# Patient Record
Sex: Female | Born: 1960 | Race: White | Hispanic: No | Marital: Married | Smoking: Never smoker
Health system: Southern US, Community
[De-identification: ages and names within clinical notes are randomized; demographics above are authoritative.]

## PROBLEM LIST (undated history)

## (undated) DIAGNOSIS — I739 Peripheral vascular disease, unspecified: Secondary | ICD-10-CM

## (undated) DIAGNOSIS — N200 Calculus of kidney: Secondary | ICD-10-CM

## (undated) DIAGNOSIS — I10 Essential (primary) hypertension: Secondary | ICD-10-CM

## (undated) HISTORY — DX: Peripheral vascular disease, unspecified: I73.9

## (undated) HISTORY — PX: COLONOSCOPY: SHX174

## (undated) HISTORY — DX: Calculus of kidney: N20.0

## (undated) HISTORY — PX: ABDOMINAL HYSTERECTOMY: SHX81

---

## 2004-02-28 ENCOUNTER — Ambulatory Visit: Payer: Self-pay | Admitting: Unknown Physician Specialty

## 2004-03-31 HISTORY — PX: TOTAL ABDOMINAL HYSTERECTOMY W/ BILATERAL SALPINGOOPHORECTOMY: SHX83

## 2004-04-09 ENCOUNTER — Inpatient Hospital Stay: Payer: Self-pay | Admitting: Unknown Physician Specialty

## 2004-12-10 ENCOUNTER — Ambulatory Visit: Payer: Self-pay

## 2005-01-08 ENCOUNTER — Ambulatory Visit: Payer: Self-pay

## 2005-05-30 ENCOUNTER — Ambulatory Visit: Payer: Self-pay | Admitting: Unknown Physician Specialty

## 2005-12-04 ENCOUNTER — Ambulatory Visit: Payer: Self-pay | Admitting: Gastroenterology

## 2006-08-19 ENCOUNTER — Ambulatory Visit: Payer: Self-pay | Admitting: Unknown Physician Specialty

## 2007-11-10 ENCOUNTER — Ambulatory Visit: Payer: Self-pay | Admitting: Unknown Physician Specialty

## 2008-12-06 LAB — HM PAP SMEAR: HM Pap smear: NEGATIVE

## 2008-12-14 ENCOUNTER — Ambulatory Visit: Payer: Self-pay | Admitting: Unknown Physician Specialty

## 2010-04-05 ENCOUNTER — Ambulatory Visit: Payer: Self-pay | Admitting: Unknown Physician Specialty

## 2010-04-09 ENCOUNTER — Emergency Department: Payer: Self-pay | Admitting: Emergency Medicine

## 2011-09-10 ENCOUNTER — Ambulatory Visit: Payer: Self-pay | Admitting: Family Medicine

## 2012-09-10 ENCOUNTER — Ambulatory Visit: Payer: Self-pay | Admitting: Family Medicine

## 2013-11-16 ENCOUNTER — Ambulatory Visit: Payer: Self-pay | Admitting: Family Medicine

## 2013-11-16 LAB — HM MAMMOGRAPHY

## 2014-09-27 ENCOUNTER — Other Ambulatory Visit
Admission: RE | Admit: 2014-09-27 | Disposition: A | Discharge status: Home / Self Care | Payer: BLUE CROSS/BLUE SHIELD | Source: Ambulatory Visit | Attending: Cardiology | Admitting: Cardiology

## 2014-09-28 ENCOUNTER — Other Ambulatory Visit
Admission: RE | Admit: 2014-09-28 | Discharge: 2014-09-28 | Disposition: A | Discharge status: Home / Self Care | Payer: BLUE CROSS/BLUE SHIELD | Source: Ambulatory Visit | Attending: Cardiology | Admitting: Cardiology

## 2014-09-29 ENCOUNTER — Other Ambulatory Visit
Admission: RE | Admit: 2014-09-29 | Discharge: 2014-09-29 | Disposition: A | Discharge status: Home / Self Care | Payer: BLUE CROSS/BLUE SHIELD | Source: Ambulatory Visit | Attending: Internal Medicine | Admitting: Internal Medicine

## 2014-09-29 DIAGNOSIS — Z1501 Genetic susceptibility to malignant neoplasm of breast: Secondary | ICD-10-CM | POA: Diagnosis present

## 2014-09-29 DIAGNOSIS — Z0189 Encounter for other specified special examinations: Secondary | ICD-10-CM | POA: Insufficient documentation

## 2014-09-29 LAB — CBC WITH DIFFERENTIAL/PLATELET
Basophils Absolute: 0.1 10*3/uL (ref 0–0.1)
Basophils Relative: 1 %
Eosinophils Absolute: 0.6 10*3/uL (ref 0–0.7)
Eosinophils Relative: 8 %
HCT: 39.6 % (ref 35.0–47.0)
Hemoglobin: 13.7 g/dL (ref 12.0–16.0)
Lymphocytes Relative: 32 %
Lymphs Abs: 2.2 10*3/uL (ref 1.0–3.6)
MCH: 29.4 pg (ref 26.0–34.0)
MCHC: 34.6 g/dL (ref 32.0–36.0)
MCV: 85 fL (ref 80.0–100.0)
Monocytes Absolute: 0.5 10*3/uL (ref 0.2–0.9)
Monocytes Relative: 8 %
Neutro Abs: 3.6 10*3/uL (ref 1.4–6.5)
Neutrophils Relative %: 51 %
Platelets: 240 10*3/uL (ref 150–440)
RBC: 4.65 MIL/uL (ref 3.80–5.20)
RDW: 13 % (ref 11.5–14.5)
WBC: 6.9 10*3/uL (ref 3.6–11.0)

## 2014-09-29 LAB — BASIC METABOLIC PANEL
Anion gap: 13 (ref 5–15)
BUN: 8 mg/dL (ref 6–20)
CO2: 22 mmol/L (ref 22–32)
Calcium: 9 mg/dL (ref 8.9–10.3)
Chloride: 107 mmol/L (ref 101–111)
Creatinine, Ser: 0.87 mg/dL (ref 0.44–1.00)
GFR calc Af Amer: >60 mL/min (ref >60)
GFR calc non Af Amer: >60 mL/min (ref >60)
Glucose, Bld: 100 mg/dL — ABNORMAL HIGH (ref 65–99)
Potassium: 4 mmol/L (ref 3.5–5.1)
Sodium: 142 mmol/L (ref 135–145)

## 2014-09-29 LAB — LIPID PANEL
Cholesterol: 223 mg/dL — ABNORMAL HIGH (ref 0–200)
HDL: 47 mg/dL (ref >40)
LDL Cholesterol: 152 mg/dL — ABNORMAL HIGH (ref 0–99)
Total CHOL/HDL Ratio: 4.7 RATIO
Triglycerides: 120 mg/dL (ref <150)
VLDL: 24 mg/dL (ref 0–40)

## 2014-09-29 LAB — TSH: TSH: 2.924 u[IU]/mL (ref 0.350–4.500)

## 2014-10-18 LAB — MISC LABCORP TEST (SEND OUT)
LabCorp test name: 252911
Labcorp test code: 252911

## 2015-05-13 ENCOUNTER — Emergency Department
Admission: EM | Admit: 2015-05-13 | Discharge: 2015-05-14 | Disposition: A | Discharge status: Home / Self Care | Payer: BLUE CROSS/BLUE SHIELD | Attending: Emergency Medicine | Admitting: Emergency Medicine

## 2015-05-13 ENCOUNTER — Encounter: Payer: Self-pay | Admitting: Emergency Medicine

## 2015-05-13 DIAGNOSIS — I10 Essential (primary) hypertension: Secondary | ICD-10-CM | POA: Insufficient documentation

## 2015-05-13 DIAGNOSIS — R42 Dizziness and giddiness: Secondary | ICD-10-CM | POA: Diagnosis present

## 2015-05-13 DIAGNOSIS — F439 Reaction to severe stress, unspecified: Secondary | ICD-10-CM | POA: Diagnosis not present

## 2015-05-13 HISTORY — DX: Essential (primary) hypertension: I10

## 2015-05-13 LAB — CBC
HCT: 40.4 % (ref 35.0–47.0)
Hemoglobin: 14.2 g/dL (ref 12.0–16.0)
MCH: 29.5 pg (ref 26.0–34.0)
MCHC: 35.1 g/dL (ref 32.0–36.0)
MCV: 84 fL (ref 80.0–100.0)
Platelets: 283 10*3/uL (ref 150–440)
RBC: 4.81 MIL/uL (ref 3.80–5.20)
RDW: 12.8 % (ref 11.5–14.5)
WBC: 8.5 10*3/uL (ref 3.6–11.0)

## 2015-05-13 LAB — URINALYSIS COMPLETE WITH MICROSCOPIC (ARMC ONLY)
Bacteria, UA: NONE SEEN
Bilirubin Urine: NEGATIVE
Glucose, UA: NEGATIVE mg/dL
Hgb urine dipstick: NEGATIVE
Ketones, ur: NEGATIVE mg/dL
Nitrite: NEGATIVE
Protein, ur: NEGATIVE mg/dL
Specific Gravity, Urine: 1.006 (ref 1.005–1.030)
pH: 7 (ref 5.0–8.0)

## 2015-05-13 LAB — GLUCOSE, CAPILLARY: Glucose-Capillary: 118 mg/dL — ABNORMAL HIGH (ref 65–99)

## 2015-05-13 LAB — BASIC METABOLIC PANEL
Anion gap: 7 (ref 5–15)
BUN: 9 mg/dL (ref 6–20)
CO2: 25 mmol/L (ref 22–32)
Calcium: 9.2 mg/dL (ref 8.9–10.3)
Chloride: 110 mmol/L (ref 101–111)
Creatinine, Ser: 0.88 mg/dL (ref 0.44–1.00)
GFR calc Af Amer: >60 mL/min (ref >60)
GFR calc non Af Amer: >60 mL/min (ref >60)
Glucose, Bld: 117 mg/dL — ABNORMAL HIGH (ref 65–99)
Potassium: 3.8 mmol/L (ref 3.5–5.1)
Sodium: 142 mmol/L (ref 135–145)

## 2015-05-13 NOTE — ED Notes (Signed)
Pt. States feeling dizzy for the past couple days worse today.  Pt. States increased stressors recently.  Pt. States mild rash to face.

## 2015-05-13 NOTE — ED Provider Notes (Signed)
[facility] Emergency Department Provider Note  ____________________________________________  Time seen: Approximately 11:29 PM  I have reviewed the triage vital signs and the nursing notes.   HISTORY  Chief Complaint Dizziness    HPI Angie Duffy is a 55 y.o. female who comes into the hospital today with some dizziness. The patient reports that she was sitting on the couch tonight and she started feeling dizzy. She reports that she also had symptoms of this started 3 days ago and it's been coming and going. The patient thought she may be getting sick or may be having vertigo as her mother has had vertigo in the past. The patient reports that her blood pressure was high for her and it was 161 systolic. The patient also had a strange sensation in her head and the more she thought about it the more she worried so she decided to come in and get checked out. 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 She reports that she was feeling lightheaded and not like the room was spinning. The patient reports that she does not eat much protein and tries to watch what she drinks but is unsure she's been drinking enough. The patient reports that one week ago she had a bright flashing light in her eyes that lasted a few minutes but was never followed by anything and it resolved on its own. The patient drank 2 protein shakes before she came in tonight. She reports her hair is been thinning and she is also concerned because she put her mom in assisted living and she knows her mom did not want to be there. The patient is here for evaluation.   Past Medical History  Diagnosis Date  . Hypertension     There are no active problems to display for this patient.   Past Surgical History  Procedure Laterality Date  . Abdominal hysterectomy      No current outpatient prescriptions on  file.  Allergies Levaquin  History reviewed. No pertinent family history.  Social History Social History  Substance Use Topics  . Smoking status: Never Smoker   . Smokeless tobacco: None  . Alcohol Use: No    Review of Systems Constitutional: No fever/chills Eyes: No visual changes. ENT: No sore throat. Cardiovascular: Denies chest pain. Respiratory: Denies shortness of breath. Gastrointestinal: No abdominal pain.  No nausea, no vomiting.  No diarrhea.  No constipation. Genitourinary: Negative for dysuria. Musculoskeletal: Negative for back pain. Skin: Negative for rash. Neurological: Dizziness with no headache  10-point ROS otherwise negative.  ____________________________________________   PHYSICAL EXAM:  VITAL SIGNS: ED Triage Vitals  Enc Vitals Group     BP 05/13/15 2039 162/94 mmHg     Pulse Rate 05/13/15 2039 78     Resp 05/13/15 2039 18     Temp 05/13/15 2039 98.2 F (36.8 C)     Temp src --      SpO2 05/13/15 2039 98 %     Weight 05/13/15 2039 223 lb (101.152 kg)     Height 05/13/15 2039  (1.727 m)     Head Cir --      Peak Flow --      Pain Score 05/13/15 2040 0     Pain Loc --      Pain Edu? --      Excl. in GC? --     Constitutional: Alert and  oriented. Well appearing and in no acute distress. Eyes: Conjunctivae are normal. PERRL. EOMI. Head: Atraumatic. Nose: No congestion/rhinnorhea. Mouth/Throat: Mucous membranes are moist.  Oropharynx non-erythematous. Cardiovascular: Normal rate, regular rhythm. Grossly normal heart sounds.  Good peripheral circulation. Respiratory: Normal respiratory effort.  No retractions. Lungs CTAB. Gastrointestinal: Soft and nontender. No distention. Positive bowel sounds Musculoskeletal: No lower extremity tenderness nor edema.   Neurologic:  Normal speech and language. No gross focal neurologic deficits are appreciated. Patient with no finger to limb ataxia and cranial nerves II through XII are grossly  intact Skin:  Skin is warm, dry and intact. Psychiatric: Mood and affect are normal.   ____________________________________________   LABS (all labs ordered are listed, but only abnormal results are displayed)  Labs Reviewed  URINALYSIS COMPLETEWITH MICROSCOPIC ([facility] ONLY) - Abnormal; Notable for the following:    Color, Urine YELLOW (*)    APPearance HAZY (*)    Leukocytes, UA 1+ (*)    Squamous Epithelial / LPF 0-5 (*)    All other components within normal limits  BASIC METABOLIC PANEL - Abnormal; Notable for the following:    Glucose, Bld 117 (*)    All other components within normal limits  GLUCOSE, CAPILLARY - Abnormal; Notable for the following:    Glucose-Capillary 118 (*)    All other components within normal limits  CBC  TROPONIN I  CBG MONITORING, ED   ____________________________________________  EKG  ED ECG REPORT I, Rebecka Apley, the attending physician, personally viewed and interpreted this ECG.   Date: 05/13/2015  EKG Time: 2025  Rate: 81  Rhythm: normal sinus rhythm  Axis: Normal  Intervals:none  ST&T Change: None  ____________________________________________  RADIOLOGY  CT head: No acute intracranial abnormalities ____________________________________________   PROCEDURES  Procedure(s) performed: None  Critical Care performed: No  ____________________________________________   INITIAL IMPRESSION / ASSESSMENT AND PLAN / ED COURSE  Pertinent labs & imaging results that were available during my care of the patient were reviewed by me and considered in my medical decision making (see chart for details).  This is a 55 year old female who comes into the hospital today with some dizziness. I will check orthostatics as the patient reports that she has not been drinking much. I did offer to do a CT scan on the patient but the patient is concerned about cancer in the future. I will give her some time as I add on a troponin as well as do  orthostatics and then reassess if the patient would like to receive a CT scan to evaluate her symptoms. The patient's blood pressure at this time is 159/86.  The patient was not orthostatic and her CT head was unremarkable. The patient at this time is having no symptoms but is mildly anxious about what it may mean. The patient is asking about atrophy of her brain and still concerned that her CT scan is going to give her brain cancer. I did attempt to reassure the patient and I informed her that she should follow back up with her primary care physician for further evaluation of her symptoms. The patient will be discharged home to follow-up with her primary care physician. ____________________________________________   FINAL CLINICAL IMPRESSION(S) / ED DIAGNOSES  Final diagnoses:  Dizziness  Lightheadedness      Rebecka Apley, MD 05/14/15 0221

## 2015-05-13 NOTE — ED Notes (Signed)
Blood sugar in triage 118.

## 2015-05-14 ENCOUNTER — Emergency Department: Payer: BLUE CROSS/BLUE SHIELD

## 2015-05-14 LAB — TROPONIN I: Troponin I: <0.03 ng/mL (ref <0.031)

## 2015-05-14 NOTE — ED Notes (Signed)
Pt discharged to home.  Family member driving.  Discharge instructions reviewed.  Verbalized understanding.  No questions or concerns at this time.  Teach back verified.  Pt in NAD.  No items left in ED.   

## 2015-05-14 NOTE — Discharge Instructions (Signed)

## 2015-05-14 NOTE — ED Notes (Signed)
Patient transported to CT 

## 2015-08-08 DIAGNOSIS — L821 Other seborrheic keratosis: Secondary | ICD-10-CM | POA: Diagnosis not present

## 2015-08-08 DIAGNOSIS — Z1283 Encounter for screening for malignant neoplasm of skin: Secondary | ICD-10-CM | POA: Diagnosis not present

## 2015-08-08 DIAGNOSIS — D485 Neoplasm of uncertain behavior of skin: Secondary | ICD-10-CM | POA: Diagnosis not present

## 2015-08-08 DIAGNOSIS — S30861A Insect bite (nonvenomous) of abdominal wall, initial encounter: Secondary | ICD-10-CM | POA: Diagnosis not present

## 2015-09-06 DIAGNOSIS — D225 Melanocytic nevi of trunk: Secondary | ICD-10-CM | POA: Diagnosis not present

## 2015-09-06 DIAGNOSIS — D485 Neoplasm of uncertain behavior of skin: Secondary | ICD-10-CM | POA: Diagnosis not present

## 2017-02-13 IMAGING — CT CT HEAD W/O CM
1 series · 16 of 30 positions shown, 20 images · non-contrast
Comparison: None.

CLINICAL DATA: Dizziness for couple of days, worse today.

EXAM:
CT HEAD WITHOUT CONTRAST
TECHNIQUE: Contiguous axial images were obtained from the base of the skull
through the vertex without intravenous contrast.

[Series 2: head wo · axial · 0.47mm/px · z∈[-175,-31]mm · 16 of 36 slices shown, 20 images]
[im 2/36  brain]
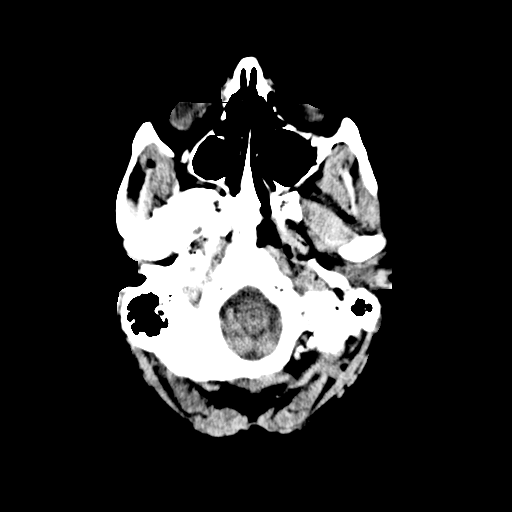
[im 2/36  bone]
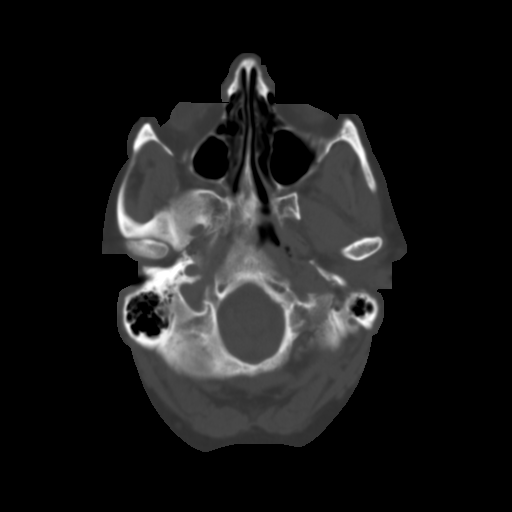
[im 4/36  brain]
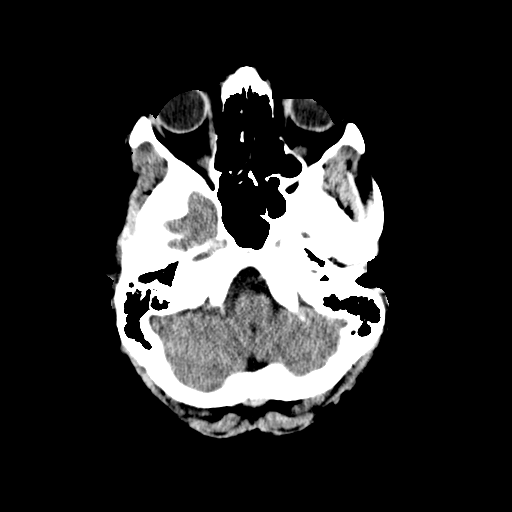
[im 7/36  brain]
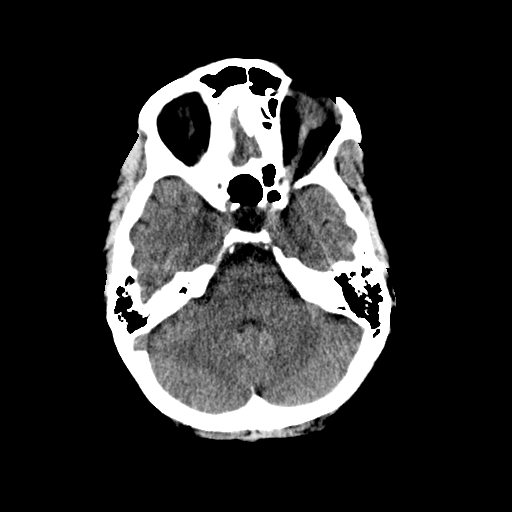
[im 9/36  brain]
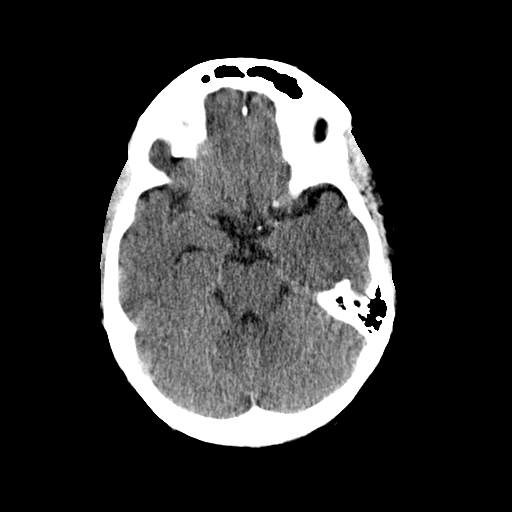
[im 10/36  brain]
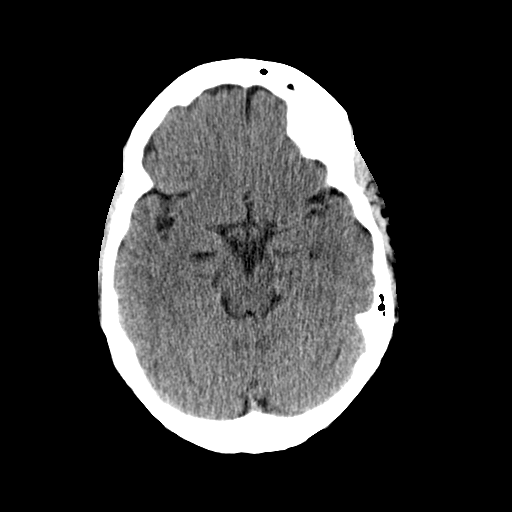
[im 10/36  bone]
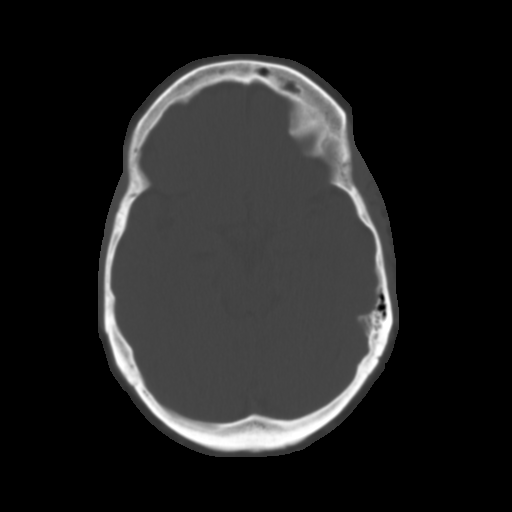
[im 13/36  brain]
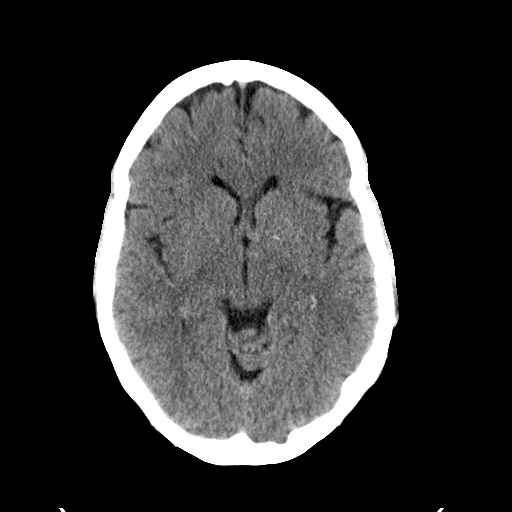
[im 15/36  brain]
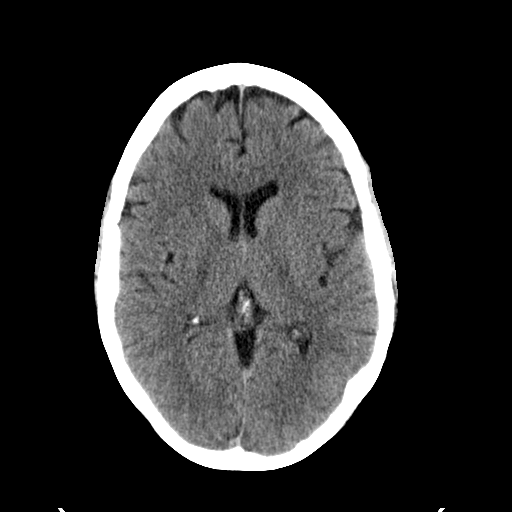
[im 17/36  brain]
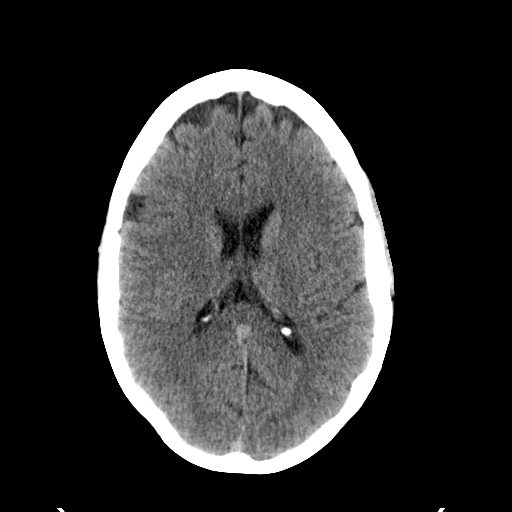
[im 19/36  brain]
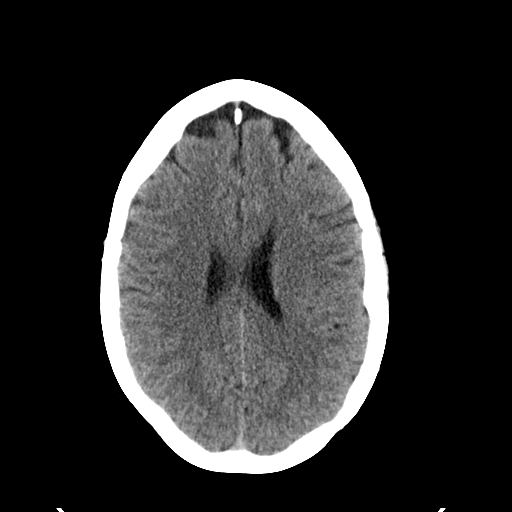
[im 19/36  bone]
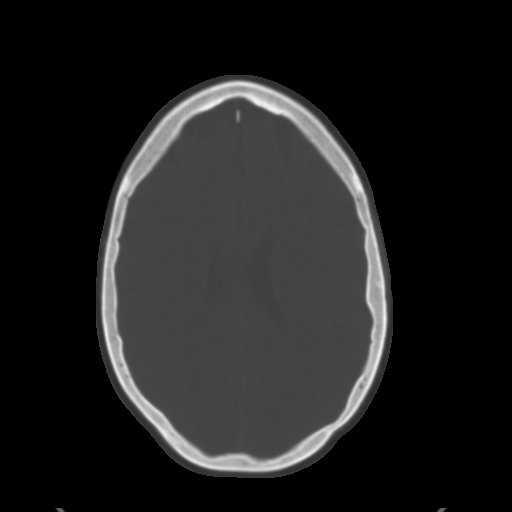
[im 21/36  brain]
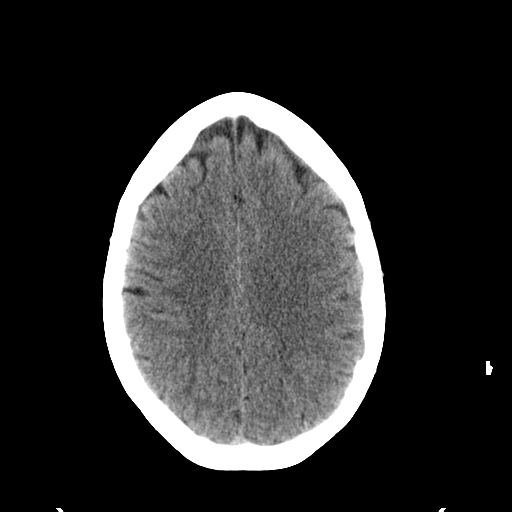
[im 23/36  brain]
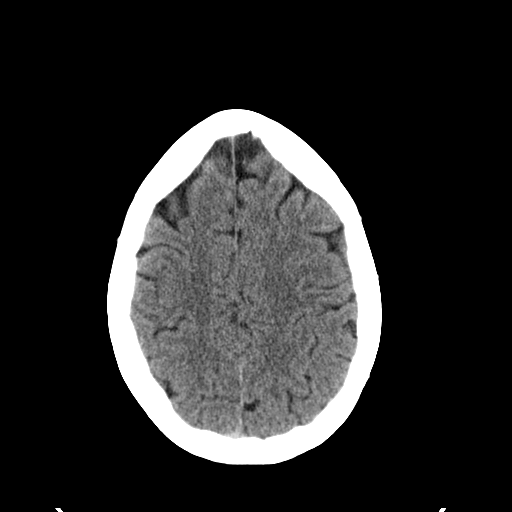
[im 26/36  brain]
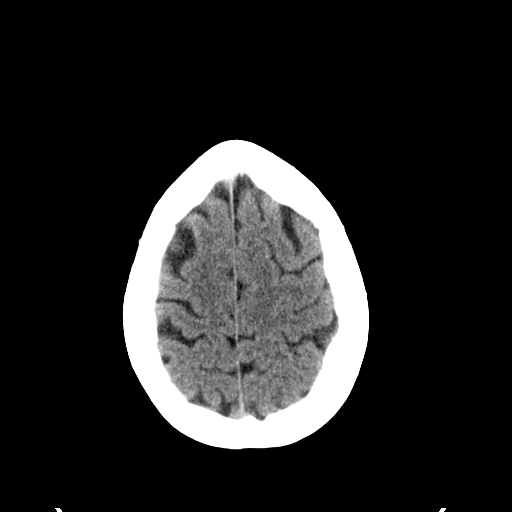
[im 27/36  brain]
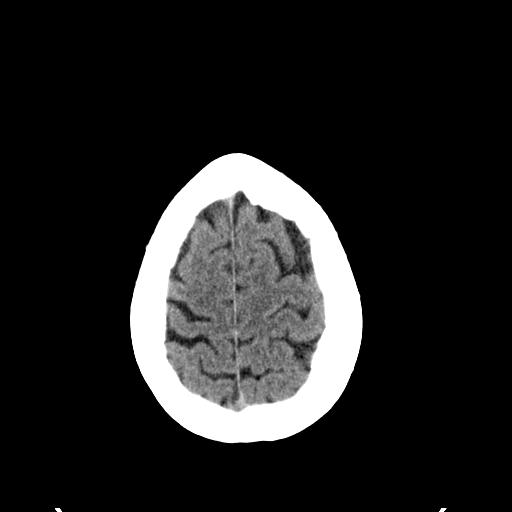
[im 27/36  bone]
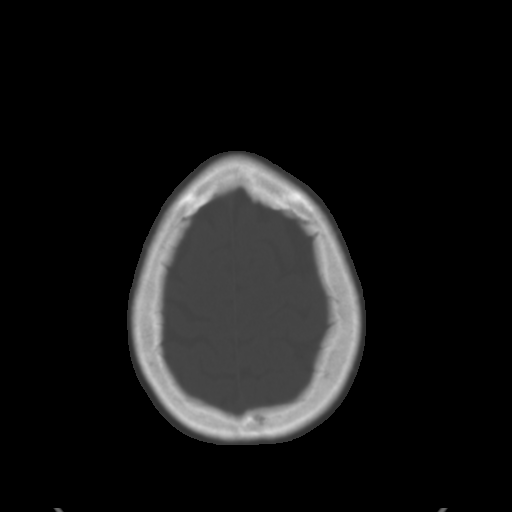
[im 29/36  brain]
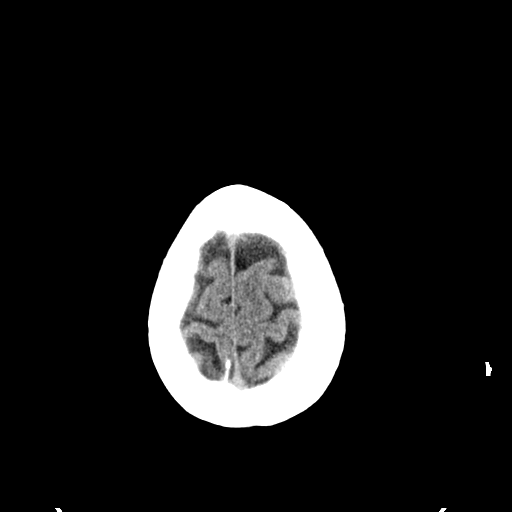
[im 32/36  brain]
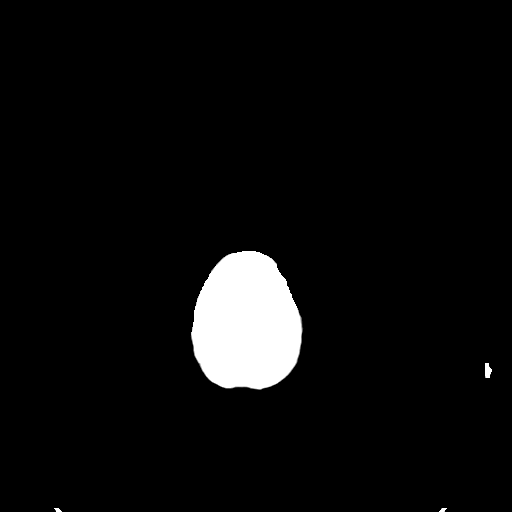
[im 34/36  brain]
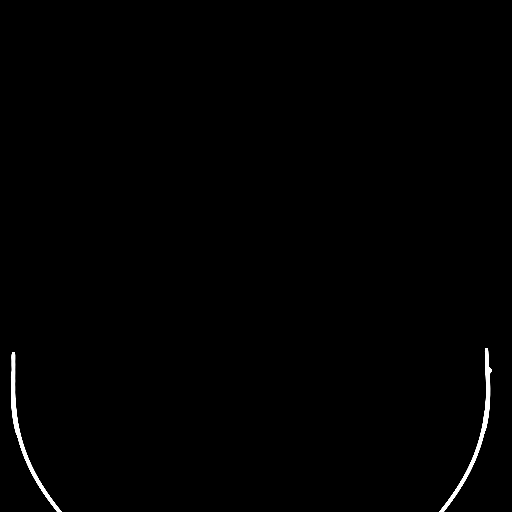

[16 of 30 positions shown; findings below may reference images not displayed]

FINDINGS: Ventricles and sulci appear symmetrical. No ventricular dilatation.
No mass effect or midline shift. No abnormal extra-axial fluid
collections. Gray-white matter junctions are distinct. Basal
cisterns are not effaced. No evidence of acute intracranial
hemorrhage. No depressed skull fractures. Visualized paranasal
sinuses and mastoid air cells are not opacified.
IMPRESSION: No acute intracranial abnormalities.

## 2017-03-18 ENCOUNTER — Other Ambulatory Visit: Payer: Self-pay | Admitting: Cardiology

## 2017-03-18 DIAGNOSIS — Z1231 Encounter for screening mammogram for malignant neoplasm of breast: Secondary | ICD-10-CM

## 2017-04-01 ENCOUNTER — Encounter: Payer: Self-pay | Admitting: Obstetrics and Gynecology

## 2017-04-01 ENCOUNTER — Ambulatory Visit: Payer: BLUE CROSS/BLUE SHIELD | Admitting: Obstetrics and Gynecology

## 2017-04-01 VITALS — BP 130/80 | HR 62 | Ht 68.0 in | Wt 230.0 lb

## 2017-04-01 DIAGNOSIS — Z13220 Encounter for screening for lipoid disorders: Secondary | ICD-10-CM | POA: Diagnosis not present

## 2017-04-01 DIAGNOSIS — Z1321 Encounter for screening for nutritional disorder: Secondary | ICD-10-CM

## 2017-04-01 DIAGNOSIS — Z1329 Encounter for screening for other suspected endocrine disorder: Secondary | ICD-10-CM

## 2017-04-01 DIAGNOSIS — Z1231 Encounter for screening mammogram for malignant neoplasm of breast: Secondary | ICD-10-CM

## 2017-04-01 DIAGNOSIS — L659 Nonscarring hair loss, unspecified: Secondary | ICD-10-CM | POA: Diagnosis not present

## 2017-04-01 DIAGNOSIS — Z1239 Encounter for other screening for malignant neoplasm of breast: Secondary | ICD-10-CM

## 2017-04-01 DIAGNOSIS — Z131 Encounter for screening for diabetes mellitus: Secondary | ICD-10-CM

## 2017-04-01 DIAGNOSIS — R6882 Decreased libido: Secondary | ICD-10-CM

## 2017-04-01 DIAGNOSIS — Z0000 Encounter for general adult medical examination without abnormal findings: Secondary | ICD-10-CM

## 2017-04-01 NOTE — Patient Instructions (Signed)
I value your feedback and entrusting us with your care. If you get a [facility] patient survey, I would appreciate you taking the time to let us know about your experience today. Thank you! 

## 2017-04-01 NOTE — Progress Notes (Signed)
Chief Complaint  Patient presents with  . Advice Only    hair falling out, thyroid testing    HPI:      [name] is a 57 y.o. [id number] who LMP was No LMP recorded. Patient has had a hysterectomy., presents today for NP eval of thinning hair for many yrs. Saw Dr. Dear for same sx in 2014 and has had neg thyroid labs in past. Pt's mom had thinning hair. Pt is vegan and doesn't get a lot of protein in her diet.   She is s/p TAHBSO 2006 for menorrhagia/leio. She denies any vasomotor sx but does have decreased libido. She wonders if HRT would be helpful.   She would like fasting labs done since she has not had insurance till recently.   She notices white bumps under her bilat breasts, particularly worse after exercise. No dryer sheet use. No breast masses/pain. Did note RT axillary mass/tenderness a few wks ago. Sx resolved. Has mammo sched 04/13/17.   Past Medical History:  Diagnosis Date  . Calculus of kidney   . Hypertension     Past Surgical History:  Procedure Laterality Date  . ABDOMINAL HYSTERECTOMY    . COLONOSCOPY    . TOTAL ABDOMINAL HYSTERECTOMY W/ BILATERAL SALPINGOOPHORECTOMY  2006    Family History  Problem Relation Age of Onset  . Emphysema Father   . Breast cancer Sister     Social History   Socioeconomic History  . Marital status: Married    Spouse name: Not on file  . Number of children: Not on file  . Years of education: Not on file  . Highest education level: Not on file  Social Needs  . Financial resource strain: Not on file  . Food insecurity - worry: Not on file  . Food insecurity - inability: Not on file  . Transportation needs - medical: Not on file  . Transportation needs - non-medical: Not on file  Occupational History  . Not on file  Tobacco Use  . Smoking status: Never Smoker  . Smokeless tobacco: Never Used  Substance and Sexual Activity  . Alcohol use: No  . Drug use: No  . Sexual activity: Not on file  Other Topics  Concern  . Not on file  Social History Narrative  . Not on file    No current outpatient medications on file.   ROS:  Review of Systems  Constitutional: Negative for fatigue, fever and unexpected weight change.  Respiratory: Negative for cough, shortness of breath and wheezing.   Cardiovascular: Negative for chest pain, palpitations and leg swelling.  Gastrointestinal: Negative for blood in stool, constipation, diarrhea, nausea and vomiting.  Endocrine: Negative for cold intolerance, heat intolerance and polyuria.  Genitourinary: Negative for dyspareunia, dysuria, flank pain, frequency, genital sores, hematuria, menstrual problem, pelvic pain, urgency, vaginal bleeding, vaginal discharge and vaginal pain.  Musculoskeletal: Negative for back pain, joint swelling and myalgias.  Skin: Positive for rash.  Neurological: Negative for dizziness, syncope, light-headedness, numbness and headaches.  Hematological: Negative for adenopathy.  Psychiatric/Behavioral: Negative for agitation, confusion, sleep disturbance and suicidal ideas. The patient is not nervous/anxious.      OBJECTIVE:   Vitals:  BP 130/80   Pulse 62   Ht 5\' 8"  (1.727 m)   Wt 230 lb (104.3 kg)   BMI 34.97 kg/m   Physical Exam  Constitutional: She is oriented to person, place, and time and well-developed, well-nourished, and in no distress.  Pulmonary/Chest: Right breast exhibits no  inverted nipple, no mass, no nipple discharge, no skin change and no tenderness. Left breast exhibits no inverted nipple, no mass, no nipple discharge, no skin change and no tenderness. Breasts are symmetrical.  Lymphadenopathy:    She has no axillary adenopathy.  Neurological: She is alert and oriented to person, place, and time.  Psychiatric: Memory, affect and judgment normal.  Vitals reviewed.   Assessment/Plan: Hair loss - Thinning hair for many yrs with neg thyroid labs in past. Pos FH hair thinning in mom. Check labs. If neg,  most likely hereditary. Can see derm for tx options. - Plan: TSH + free T4  Blood tests for routine general physical examination - Plan: Comprehensive metabolic panel, Lipid panel, Hemoglobin A1c, TSH + free T4, VITAMIN D 25 Hydroxy (Vit-D Deficiency, Fractures), B12 and Folate Panel  Screening cholesterol level - Plan: Lipid panel  Encounter for vitamin deficiency screening - Plan: VITAMIN D 25 Hydroxy (Vit-D Deficiency, Fractures), B12 and Folate Panel  Thyroid disorder screening - Plan: TSH + free T4  Screening for diabetes mellitus - Plan: Hemoglobin A1c  Screening breast examination - Neg exam. Has mammo sched.  Decreased libido - Discussed pros/cons HRT. Pt declines for now.     Return if symptoms worsen or fail to improve.  Angie Duffy B. Kyarra Vancamp, PA-C 04/01/2017 9:29 AM

## 2017-04-03 ENCOUNTER — Other Ambulatory Visit: Payer: BLUE CROSS/BLUE SHIELD

## 2017-04-03 DIAGNOSIS — Z1321 Encounter for screening for nutritional disorder: Secondary | ICD-10-CM

## 2017-04-03 DIAGNOSIS — Z1329 Encounter for screening for other suspected endocrine disorder: Secondary | ICD-10-CM

## 2017-04-03 DIAGNOSIS — Z0000 Encounter for general adult medical examination without abnormal findings: Secondary | ICD-10-CM | POA: Diagnosis not present

## 2017-04-03 DIAGNOSIS — L659 Nonscarring hair loss, unspecified: Secondary | ICD-10-CM

## 2017-04-03 DIAGNOSIS — Z13220 Encounter for screening for lipoid disorders: Secondary | ICD-10-CM

## 2017-04-03 DIAGNOSIS — Z131 Encounter for screening for diabetes mellitus: Secondary | ICD-10-CM

## 2017-04-04 LAB — COMPREHENSIVE METABOLIC PANEL
ALT: 43 IU/L — ABNORMAL HIGH (ref 0–32)
AST: 28 IU/L (ref 0–40)
Albumin/Globulin Ratio: 1.6 (ref 1.2–2.2)
Albumin: 4.2 g/dL (ref 3.5–5.5)
Alkaline Phosphatase: 56 IU/L (ref 39–117)
BUN/Creatinine Ratio: 13 (ref 9–23)
BUN: 11 mg/dL (ref 6–24)
Bilirubin Total: 0.4 mg/dL (ref 0.0–1.2)
CO2: 23 mmol/L (ref 20–29)
Calcium: 9.3 mg/dL (ref 8.7–10.2)
Chloride: 107 mmol/L — ABNORMAL HIGH (ref 96–106)
Creatinine, Ser: 0.87 mg/dL (ref 0.57–1.00)
GFR calc Af Amer: 86 mL/min/{1.73_m2} (ref >59)
GFR calc non Af Amer: 75 mL/min/{1.73_m2} (ref >59)
Globulin, Total: 2.7 g/dL (ref 1.5–4.5)
Glucose: 97 mg/dL (ref 65–99)
Potassium: 4.8 mmol/L (ref 3.5–5.2)
Sodium: 145 mmol/L — ABNORMAL HIGH (ref 134–144)
Total Protein: 6.9 g/dL (ref 6.0–8.5)

## 2017-04-04 LAB — B12 AND FOLATE PANEL
Folate: 10.3 ng/mL (ref >3.0)
Vitamin B-12: 207 pg/mL — ABNORMAL LOW (ref 232–1245)

## 2017-04-04 LAB — LIPID PANEL
Chol/HDL Ratio: 3.7 ratio (ref 0.0–4.4)
Cholesterol, Total: 216 mg/dL — ABNORMAL HIGH (ref 100–199)
HDL: 58 mg/dL (ref >39)
LDL Calculated: 139 mg/dL — ABNORMAL HIGH (ref 0–99)
Triglycerides: 96 mg/dL (ref 0–149)
VLDL Cholesterol Cal: 19 mg/dL (ref 5–40)

## 2017-04-04 LAB — TSH+FREE T4
Free T4: 1.03 ng/dL (ref 0.82–1.77)
TSH: 1.42 u[IU]/mL (ref 0.450–4.500)

## 2017-04-04 LAB — VITAMIN D 25 HYDROXY (VIT D DEFICIENCY, FRACTURES): Vit D, 25-Hydroxy: 21.1 ng/mL — ABNORMAL LOW (ref 30.0–100.0)

## 2017-04-04 LAB — HEMOGLOBIN A1C
Est. average glucose Bld gHb Est-mCnc: 114 mg/dL
Hgb A1c MFr Bld: 5.6 % (ref 4.8–5.6)

## 2017-04-06 ENCOUNTER — Other Ambulatory Visit: Payer: BLUE CROSS/BLUE SHIELD

## 2017-04-06 DIAGNOSIS — Z85820 Personal history of malignant melanoma of skin: Secondary | ICD-10-CM | POA: Diagnosis not present

## 2017-04-06 DIAGNOSIS — L281 Prurigo nodularis: Secondary | ICD-10-CM | POA: Diagnosis not present

## 2017-04-06 DIAGNOSIS — Z86018 Personal history of other benign neoplasm: Secondary | ICD-10-CM | POA: Diagnosis not present

## 2017-04-06 DIAGNOSIS — L578 Other skin changes due to chronic exposure to nonionizing radiation: Secondary | ICD-10-CM | POA: Diagnosis not present

## 2017-04-13 ENCOUNTER — Ambulatory Visit
Admission: RE | Admit: 2017-04-13 | Discharge: 2017-04-13 | Disposition: A | Discharge status: Home / Self Care | Payer: BLUE CROSS/BLUE SHIELD | Source: Ambulatory Visit | Attending: Cardiology | Admitting: Cardiology

## 2017-04-13 DIAGNOSIS — Z1231 Encounter for screening mammogram for malignant neoplasm of breast: Secondary | ICD-10-CM | POA: Insufficient documentation

## 2017-04-16 DIAGNOSIS — D485 Neoplasm of uncertain behavior of skin: Secondary | ICD-10-CM | POA: Diagnosis not present

## 2017-05-06 DIAGNOSIS — D485 Neoplasm of uncertain behavior of skin: Secondary | ICD-10-CM | POA: Diagnosis not present

## 2017-05-06 DIAGNOSIS — D229 Melanocytic nevi, unspecified: Secondary | ICD-10-CM | POA: Diagnosis not present

## 2017-06-01 ENCOUNTER — Encounter (INDEPENDENT_AMBULATORY_CARE_PROVIDER_SITE_OTHER): Payer: Self-pay | Admitting: Vascular Surgery

## 2017-06-01 ENCOUNTER — Ambulatory Visit (INDEPENDENT_AMBULATORY_CARE_PROVIDER_SITE_OTHER): Payer: BLUE CROSS/BLUE SHIELD | Admitting: Vascular Surgery

## 2017-06-01 DIAGNOSIS — I872 Venous insufficiency (chronic) (peripheral): Secondary | ICD-10-CM | POA: Diagnosis not present

## 2017-06-01 DIAGNOSIS — M79605 Pain in left leg: Secondary | ICD-10-CM

## 2017-06-01 DIAGNOSIS — M79604 Pain in right leg: Secondary | ICD-10-CM | POA: Diagnosis not present

## 2017-06-01 DIAGNOSIS — I83813 Varicose veins of bilateral lower extremities with pain: Secondary | ICD-10-CM

## 2017-06-01 DIAGNOSIS — M79606 Pain in leg, unspecified: Secondary | ICD-10-CM | POA: Insufficient documentation

## 2017-06-01 NOTE — Progress Notes (Signed)
MRN : [id number]  Angie Duffy is a 57 y.o. ([birth year]) female who presents with chief complaint of  Chief Complaint  Patient presents with  . New Patient (Initial Visit)    Varicose veins with knot on back of leg  .  History of Present Illness:The patient is seen for evaluation of symptomatic varicose veins. The patient relates burning and stinging which worsened steadily throughout the course of the day, particularly with standing. The patient also notes an aching and throbbing pain over the varicosities, particularly with prolonged dependent positions. The symptoms are significantly improved with elevation.  The patient also notes that during hot weather the symptoms are greatly intensified. The patient states the pain from the varicose veins interferes with work, daily exercise, shopping and household maintenance. At this point, the symptoms are persistent and severe enough that they're having a negative impact on lifestyle and are interfering with daily activities.  There is no history of DVT, PE or superficial thrombophlebitis. There is no history of ulceration or hemorrhage. The patient denies a significant family history of varicose veins. OB history: P2  The patient has not worn graduated compression in the past. At the present time the patient has not been using over-the-counter analgesics. There is no history of prior surgical intervention or sclerotherapy.    No outpatient medications have been marked as taking for the 06/01/17 encounter (Office Visit) with Gilda Crease, Latina Craver, MD.    Past Medical History:  Diagnosis Date  . Calculus of kidney   . Hypertension   . Peripheral vascular disease [facility])     Past Surgical History:  Procedure Laterality Date  . ABDOMINAL HYSTERECTOMY    . COLONOSCOPY    . TOTAL ABDOMINAL HYSTERECTOMY W/ BILATERAL SALPINGOOPHORECTOMY  2006    Social History Social History   Tobacco Use  . Smoking status: Never Smoker  . Smokeless  tobacco: Never Used  Substance Use Topics  . Alcohol use: No  . Drug use: No    Family History Family History  Problem Relation Age of Onset  . Emphysema Father   . Breast cancer Sister 61  No family history of bleeding/clotting disorders, porphyria or autoimmune disease   Allergies  Allergen Reactions  . Levaquin [Levofloxacin] Anaphylaxis     REVIEW OF SYSTEMS (Negative unless checked)  Constitutional: [] Weight loss  [] Fever  [] Chills Cardiac: [] Chest pain   [] Chest pressure   [] Palpitations   [] Shortness of breath when laying flat   [] Shortness of breath with exertion. Vascular:  [] Pain in legs with walking   [x] Pain in legs with standing  [] History of DVT   [] Phlebitis   [x] Swelling in legs   [x] Varicose veins   [] Non-healing ulcers Pulmonary:   [] Uses home oxygen   [] Productive cough   [] Hemoptysis   [] Wheeze  [] COPD   [] Asthma Neurologic:  [] Dizziness   [] Seizures   [] History of stroke   [] History of TIA  [] Aphasia   [] Vissual changes   [] Weakness or numbness in arm   [] Weakness or numbness in leg Musculoskeletal:   [] Joint swelling   [] Joint pain   [] Low back pain Hematologic:  [] Easy bruising  [] Easy bleeding   [] Hypercoagulable state   [] Anemic Gastrointestinal:  [] Diarrhea   [] Vomiting  [] Gastroesophageal reflux/heartburn   [] Difficulty swallowing. Genitourinary:  [] Chronic kidney disease   [] Difficult urination  [] Frequent urination   [] Blood in urine Skin:  [] Rashes   [] Ulcers  Psychological:  [] History of anxiety   []  History of major depression.  Physical  Examination  Vitals:   06/01/17 1107  BP: 137/75  Pulse: (!) 52  Resp: 16  Weight: 221 lb (100.2 kg)  Height: 5\' 8"  (1.727 m)   Body mass index is 33.6 kg/m. Gen: WD/WN, NAD Head: [place]/AT, No temporalis wasting.  Ear/Nose/Throat: Hearing grossly intact, nares w/o erythema or drainage, poor dentition Eyes: PER, EOMI, sclera nonicteric.  Neck: Supple, no masses.  No bruit or JVD.  Pulmonary:  Good air  movement, clear to auscultation bilaterally, no use of accessory muscles.  Cardiac: RRR, normal S1, S2, no Murmurs. Vascular: Large varicosities present extensively greater than 5 mm bilaterally.  Mild venous stasis changes to the legs bilaterally.  3+ soft pitting edema Vessel Right Left  Radial Palpable Palpable  PT Palpable Palpable  DP Palpable Palpable  Gastrointestinal: soft, non-distended. No guarding/no peritoneal signs.  Musculoskeletal: M/S 5/5 throughout.  No deformity or atrophy.  Neurologic: CN 2-12 intact. Pain and light touch intact in extremities.  Symmetrical.  Speech is fluent. Motor exam as listed above. Psychiatric: Judgment intact, Mood & affect appropriate for pt's clinical situation. Dermatologic: No rashes or ulcers noted.  No changes consistent with cellulitis. Lymph : No Cervical lymphadenopathy, no lichenification or skin changes of chronic lymphedema.  CBC Lab Results  Component Value Date   WBC 8.5 05/13/2015   HGB 14.2 05/13/2015   HCT 40.4 05/13/2015   MCV 84.0 05/13/2015   PLT 283 05/13/2015    BMET    Component Value Date/Time   NA 145 (H) 04/03/2017 1009   K 4.8 04/03/2017 1009   CL 107 (H) 04/03/2017 1009   CO2 23 04/03/2017 1009   GLUCOSE 97 04/03/2017 1009   GLUCOSE 117 (H) 05/13/2015 2050   BUN 11 04/03/2017 1009   CREATININE 0.87 04/03/2017 1009   CALCIUM 9.3 04/03/2017 1009   GFRNONAA 75 04/03/2017 1009   GFRAA 86 04/03/2017 1009   CrCl cannot be calculated (Patient's most recent lab result is older than the maximum 21 days allowed.).  COAG No results found for: INR, PROTIME  Radiology No results found.  Assessment/Plan 1. Pain in both lower extremities  Recommend:  The patient has large symptomatic varicose veins that are painful and associated with swelling.  I have had a long discussion with the patient regarding  varicose veins and why they cause symptoms.  Patient will begin wearing graduated compression stockings class  1 on a daily basis, beginning first thing in the morning and removing them in the evening. The patient is instructed specifically not to sleep in the stockings.    The patient  will also begin using over-the-counter analgesics such as Motrin 600 mg po TID to help control the symptoms.    In addition, behavioral modification including elevation during the day will be initiated.    Pending the results of these changes the  patient will be reevaluated in three months.   An  ultrasound of the venous system will be obtained.   Further plans will be based on the ultrasound results and whether conservative therapies are successful at eliminating the pain and swelling.   - VAS [place] LOWER EXTREMITY VENOUS REFLUX; Future  2. Varicose veins of both lower extremities with pain  Recommend:  The patient has large symptomatic varicose veins that are painful and associated with swelling.  I have had a long discussion with the patient regarding  varicose veins and why they cause symptoms.  Patient will begin wearing graduated compression stockings class 1 on a daily basis,  beginning first thing in the morning and removing them in the evening. The patient is instructed specifically not to sleep in the stockings.    The patient  will also begin using over-the-counter analgesics such as Motrin 600 mg po TID to help control the symptoms.    In addition, behavioral modification including elevation during the day will be initiated.    Pending the results of these changes the  patient will be reevaluated in three months.   An  ultrasound of the venous system will be obtained.   Further plans will be based on the ultrasound results and whether conservative therapies are successful at eliminating the pain and swelling.   3. Chronic venous insufficiency No surgery or intervention at this point in time.    I have had a long discussion with the patient regarding venous insufficiency and why it  causes symptoms. I  have discussed with the patient the chronic skin changes that accompany venous insufficiency and the long term sequela such as infection and ulceration.  Patient will begin wearing graduated compression stockings class 1 (20-30 mmHg) or compression wraps on a daily basis a prescription was given. The patient will put the stockings on first thing in the morning and removing them in the evening. The patient is instructed specifically not to sleep in the stockings.    In addition, behavioral modification including several periods of elevation of the lower extremities during the day will be continued. I have demonstrated that proper elevation is a position with the ankles at heart level.  The patient is instructed to begin routine exercise, especially walking on a daily basis  Patient should undergo duplex ultrasound of the venous system to ensure that DVT or reflux is not present.  Following the review of the ultrasound the patient will follow up in 2-3 months to reassess the degree of swelling and the control that graduated compression stockings or compression wraps  is offering.   The patient can be assessed for a Lymph Pump at that time    Levora DredgeGregory Colin Norment, MD  06/01/2017 8:29 PM

## 2017-06-17 ENCOUNTER — Ambulatory Visit (INDEPENDENT_AMBULATORY_CARE_PROVIDER_SITE_OTHER): Payer: BLUE CROSS/BLUE SHIELD | Admitting: Vascular Surgery

## 2017-06-17 ENCOUNTER — Ambulatory Visit (INDEPENDENT_AMBULATORY_CARE_PROVIDER_SITE_OTHER): Payer: BLUE CROSS/BLUE SHIELD

## 2017-06-17 ENCOUNTER — Encounter (INDEPENDENT_AMBULATORY_CARE_PROVIDER_SITE_OTHER): Payer: Self-pay | Admitting: Vascular Surgery

## 2017-06-17 VITALS — BP 137/67 | HR 56 | Resp 15 | Ht 68.0 in | Wt 224.0 lb

## 2017-06-17 DIAGNOSIS — I890 Lymphedema, not elsewhere classified: Secondary | ICD-10-CM | POA: Diagnosis not present

## 2017-06-17 DIAGNOSIS — I872 Venous insufficiency (chronic) (peripheral): Secondary | ICD-10-CM

## 2017-06-17 DIAGNOSIS — M79605 Pain in left leg: Secondary | ICD-10-CM

## 2017-06-17 DIAGNOSIS — I83813 Varicose veins of bilateral lower extremities with pain: Secondary | ICD-10-CM

## 2017-06-17 DIAGNOSIS — M79604 Pain in right leg: Secondary | ICD-10-CM | POA: Diagnosis not present

## 2017-06-17 NOTE — Progress Notes (Signed)
Subjective:    Patient ID: Angie Duffy, female    DOB: 05-29-1960, 57 y.o.   MRN: [id number] Chief Complaint  Patient presents with  . Follow-up    Bilateral LE reflux f/u   Patient presents to review vascular studies.  Patient was last seen on June 01, 2017 for evaluation of lower extremity edema and painful varicosities.  Since her initial visit, the patient has been engaging in conservative therapy including wearing medical grade 1 compression stockings, elevating her legs and remaining active.  This provided minimal relief to her symptoms.  The patient underwent a bilateral venous duplex which was notable for abnormal reflux times in the right common femoral vein, right great saphenous vein at the groin and right great saphenous vein at the proximal thigh.  Abnormal reflux times were noted in the left common femoral vein, left great saphenous vein at the groin and left great saphenous vein at the proximal thigh.  There is no evidence of deep vein thrombosis or superficial vein thrombosis bilaterally.  The patient denies any fever, nausea vomiting.   Review of Systems  Constitutional: Negative.   HENT: Negative.   Eyes: Negative.   Respiratory: Negative.   Cardiovascular: Positive for leg swelling.       Painful varicose veins  Gastrointestinal: Negative.   Endocrine: Negative.   Genitourinary: Negative.   Musculoskeletal: Negative.   Skin: Negative.   Allergic/Immunologic: Negative.   Neurological: Negative.   Hematological: Negative.   Psychiatric/Behavioral: Negative.       Objective:   Physical Exam  Constitutional: She is oriented to person, place, and time. She appears well-developed and well-nourished. No distress.  HENT:  Head: Normocephalic and atraumatic.  Eyes: Conjunctivae are normal. Pupils are equal, round, and reactive to light.  Neck: Normal range of motion.  Cardiovascular: Normal rate, regular rhythm, normal heart sounds and intact distal pulses.   Pulses:      Radial pulses are 2+ on the right side, and 2+ on the left side.       Dorsalis pedis pulses are 2+ on the right side, and 2+ on the left side.       Posterior tibial pulses are 2+ on the right side, and 2+ on the left side.  Pulmonary/Chest: Effort normal and breath sounds normal.  Musculoskeletal: She exhibits edema.  Neurological: She is alert and oriented to person, place, and time.  Skin: She is not diaphoretic.  Large varicosities present extensively greater than 5 mm bilaterally.  Mild venous stasis changes to the legs bilaterally.  3+ soft pitting edema  Psychiatric: She has a normal mood and affect. Her behavior is normal. Judgment and thought content normal.  Vitals reviewed.  BP 137/67 (BP Location: Right Arm, Patient Position: Sitting)   Pulse (!) 56   Resp 15   Ht 5\' 8"  (1.727 m)   Wt 224 lb (101.6 kg)   BMI 34.06 kg/m   Past Medical History:  Diagnosis Date  . Calculus of kidney   . Hypertension   . Peripheral vascular disease (HCC)    Social History   Socioeconomic History  . Marital status: Married    Spouse name: Not on file  . Number of children: Not on file  . Years of education: Not on file  . Highest education level: Not on file  Social Needs  . Financial resource strain: Not on file  . Food insecurity - worry: Not on file  . Food insecurity - inability: Not on file  .  Transportation needs - medical: Not on file  . Transportation needs - non-medical: Not on file  Occupational History  . Not on file  Tobacco Use  . Smoking status: Never Smoker  . Smokeless tobacco: Never Used  Substance and Sexual Activity  . Alcohol use: No  . Drug use: No  . Sexual activity: Not on file  Other Topics Concern  . Not on file  Social History Narrative  . Not on file   Past Surgical History:  Procedure Laterality Date  . ABDOMINAL HYSTERECTOMY    . COLONOSCOPY    . TOTAL ABDOMINAL HYSTERECTOMY W/ BILATERAL SALPINGOOPHORECTOMY  2006   Family  History  Problem Relation Age of Onset  . Emphysema Father   . Breast cancer Sister [id number]   Allergies  Allergen Reactions  . Levaquin [Levofloxacin] Anaphylaxis      Assessment & Plan:  Patient presents to review vascular studies.  Patient was last seen on June 01, 2017 for evaluation of lower extremity edema and painful varicosities.  Since her initial visit, the patient has been engaging in conservative therapy including wearing medical grade 1 compression stockings, elevating her legs and remaining active.  This provided minimal relief to her symptoms.  The patient underwent a bilateral venous duplex which was notable for abnormal reflux times in the right common femoral vein, right great saphenous vein at the groin and right great saphenous vein at the proximal thigh.  Abnormal reflux times were noted in the left common femoral vein, left great saphenous vein at the groin and left great saphenous vein at the proximal thigh.  There is no evidence of deep vein thrombosis or superficial vein thrombosis bilaterally.  The patient denies any fever, nausea vomiting.  1. Lymphedema - New The patient was encouraged to wear graduated compression stockings (20-30 mmHg) on a daily basis. The patient was instructed to begin wearing the stockings first thing in the morning and removing them in the evening. The patient was instructed specifically not to sleep in the stockings. Prescription given. In addition, behavioral modification including elevation during the day will be initiated. Anti-inflammatories for pain. The patient will follow up in three months to asses conservative management.  Information on chronic venous insufficiency, lympedema and compression stockings was given to the patient. The patient was instructed to call the office in the interim if any worsening edema or ulcerations to the legs, feet or toes occurs. The patient expresses their understanding.  2. Chronic venous insufficiency -  New As above  3. Varicose veins of both lower extremities with pain As above  No current outpatient medications on file prior to visit.   No current facility-administered medications on file prior to visit.    There are no Patient Instructions on file for this visit. No Follow-up on file.  Zohair Epp A Alesandro Stueve, PA-C

## 2017-09-21 ENCOUNTER — Ambulatory Visit (INDEPENDENT_AMBULATORY_CARE_PROVIDER_SITE_OTHER): Payer: BLUE CROSS/BLUE SHIELD | Admitting: Vascular Surgery

## 2017-12-02 DIAGNOSIS — Z1283 Encounter for screening for malignant neoplasm of skin: Secondary | ICD-10-CM | POA: Diagnosis not present

## 2017-12-02 DIAGNOSIS — L578 Other skin changes due to chronic exposure to nonionizing radiation: Secondary | ICD-10-CM | POA: Diagnosis not present

## 2017-12-02 DIAGNOSIS — Z86018 Personal history of other benign neoplasm: Secondary | ICD-10-CM | POA: Diagnosis not present

## 2017-12-02 DIAGNOSIS — L821 Other seborrheic keratosis: Secondary | ICD-10-CM | POA: Diagnosis not present

## 2017-12-07 DIAGNOSIS — D649 Anemia, unspecified: Secondary | ICD-10-CM | POA: Diagnosis not present

## 2017-12-07 DIAGNOSIS — E8881 Metabolic syndrome: Secondary | ICD-10-CM | POA: Diagnosis not present

## 2017-12-07 DIAGNOSIS — H9313 Tinnitus, bilateral: Secondary | ICD-10-CM | POA: Diagnosis not present

## 2017-12-08 DIAGNOSIS — L648 Other androgenic alopecia: Secondary | ICD-10-CM | POA: Diagnosis not present

## 2018-02-11 ENCOUNTER — Encounter: Payer: Self-pay | Admitting: Gastroenterology

## 2018-02-11 ENCOUNTER — Other Ambulatory Visit: Payer: Self-pay

## 2018-02-11 ENCOUNTER — Ambulatory Visit: Payer: BLUE CROSS/BLUE SHIELD | Admitting: Gastroenterology

## 2018-02-11 VITALS — BP 143/83 | HR 74 | Resp 17 | Ht 68.0 in | Wt 234.0 lb

## 2018-02-11 DIAGNOSIS — R12 Heartburn: Secondary | ICD-10-CM | POA: Diagnosis not present

## 2018-02-11 DIAGNOSIS — Z1211 Encounter for screening for malignant neoplasm of colon: Secondary | ICD-10-CM

## 2018-02-11 NOTE — Progress Notes (Signed)
Angie Repressohini R Gabrial Domine, MD [address]  [address]  [place], [place] [address]  Main: [phone]  Fax: [phone]    Gastroenterology Consultation  Referring Provider:     Corky Duffy, Javed, MD Primary Care Physician:  Angie Duffy, Javed, MD Primary Gastroenterologist:  Dr. Arlyss Repressohini R Emmy Duffy Reason for Consultation:  Heart burn, colon cancer screening        HPI:   Angie Duffy is a 57 y.o. female referred by Dr. Corky Duffy, Javed, MD  for consultation & management of chronic heart burn particularly triggered after eating fatty foods. Started about 2 years ago, takes pepcid as needed. She denies dysphagia or regurgitation. She denies weight loss. Infact, she has been gaining weight in last 2 years Was experiencing right flank pain lasted for 1&1/2 weeks, felt gassy like a bubble, took NSAID and symptoms subsided.  She does not smoke or drink alcohol NSAIDs: none  Antiplts/Anticoagulants/Anti thrombotics: none  GI Procedures: Colonoscopy 10 years ago, normal She denies family history of GI malignancy  Past Medical History:  Diagnosis Date  . Calculus of kidney   . Hypertension   . Peripheral vascular disease [facility](HCC)     Past Surgical History:  Procedure Laterality Date  . ABDOMINAL HYSTERECTOMY    . COLONOSCOPY    . TOTAL ABDOMINAL HYSTERECTOMY W/ BILATERAL SALPINGOOPHORECTOMY  2006    Current Outpatient Medications:  .  amoxicillin (AMOXIL) 500 MG capsule, TAKE 1 CAPSULE BY MOUTH EVERY 8 HOURS, Disp: , Rfl: 0 .  Vitamin D, Ergocalciferol, (DRISDOL) 1.25 MG (50000 UT) CAPS capsule, Take 50,000 Units by mouth once a week., Disp: , Rfl: 1   Family History  Problem Relation Age of Onset  . Emphysema Father   . Breast cancer Sister [phone]     Social History   Tobacco Use  . Smoking status: Never Smoker  . Smokeless tobacco: Never Used  Substance Use Topics  . Alcohol use: No  . Drug use: No    Allergies as of 02/11/2018 - Review Complete 02/11/2018  Allergen Reaction Noted    . Levaquin [levofloxacin] Anaphylaxis 05/13/2015    Review of Systems:    All systems reviewed and negative except where noted in HPI.   Physical Exam:  BP (!) 143/83 (BP Location: Left Arm, Patient Position: Sitting, Cuff Size: Large)   Pulse 74   Resp 17   Ht 5\' 8"  (1.727 m)   Wt 234 lb (106.1 kg)   BMI 35.58 kg/m  No LMP recorded. Patient has had a hysterectomy.  General:   Alert,  Well-developed, well-nourished, pleasant and cooperative in NAD Head:  Normocephalic and atraumatic. Eyes:  Sclera clear, no icterus.   Conjunctiva pink. Ears:  Normal auditory acuity. Nose:  No deformity, discharge, or lesions. Mouth:  No deformity or lesions,oropharynx pink & moist. Neck:  Supple; no masses or thyromegaly. Lungs:  Respirations even and unlabored.  Clear throughout to auscultation.   No wheezes, crackles, or rhonchi. No acute distress. Heart:  Regular rate and rhythm; no murmurs, clicks, rubs, or gallops. Abdomen:  Normal bowel sounds. Soft, non-tender and non-distended without masses, hepatosplenomegaly or hernias noted.  No guarding or rebound tenderness.   Rectal: Not performed Msk:  Symmetrical without gross deformities. Good, equal movement & strength bilaterally. Pulses:  Normal pulses noted. Extremities:  No clubbing or edema.  No cyanosis. Neurologic:  Alert and oriented x3;  grossly normal neurologically. Skin:  Intact without significant lesions or rashes. No jaundice. Lymph Nodes:  No significant cervical  adenopathy. Psych:  Alert and cooperative. Normal mood and affect.  Imaging Studies: No recent abdominal imaging  Assessment and Plan:   Angie Duffy is a 57 y.o. Caucasian female with obesity with chronic heartburn, intermittent and triggered after eating fatty foods.  LFTs normal  Heartburn Discussed with her about antireflux lifestyle Advised her to lose weight H2 blocker or PPI as needed for now EGD  Colon cancer screening Colonoscopy  I have  discussed alternative options, risks & benefits,  which include, but are not limited to, bleeding, infection, perforation,respiratory complication & drug reaction.  The patient agrees with this plan & written consent will be obtained.     Follow up based on the above work-up   Angie Repress, MD

## 2018-03-04 ENCOUNTER — Ambulatory Visit
Admission: RE | Admit: 2018-03-04 | Discharge: 2018-03-04 | Disposition: A | Discharge status: Home / Self Care | Payer: BLUE CROSS/BLUE SHIELD | Source: Ambulatory Visit | Attending: Gastroenterology | Admitting: Gastroenterology

## 2018-03-04 ENCOUNTER — Ambulatory Visit: Payer: BLUE CROSS/BLUE SHIELD | Admitting: Anesthesiology

## 2018-03-04 ENCOUNTER — Encounter
Admission: RE | Disposition: A | Discharge status: Home / Self Care | Payer: Self-pay | Source: Ambulatory Visit | Attending: Gastroenterology

## 2018-03-04 DIAGNOSIS — Z79899 Other long term (current) drug therapy: Secondary | ICD-10-CM | POA: Diagnosis not present

## 2018-03-04 DIAGNOSIS — R12 Heartburn: Secondary | ICD-10-CM | POA: Diagnosis not present

## 2018-03-04 DIAGNOSIS — Z87442 Personal history of urinary calculi: Secondary | ICD-10-CM | POA: Diagnosis not present

## 2018-03-04 DIAGNOSIS — K644 Residual hemorrhoidal skin tags: Secondary | ICD-10-CM | POA: Diagnosis not present

## 2018-03-04 DIAGNOSIS — Z1211 Encounter for screening for malignant neoplasm of colon: Secondary | ICD-10-CM | POA: Diagnosis not present

## 2018-03-04 DIAGNOSIS — K621 Rectal polyp: Secondary | ICD-10-CM | POA: Diagnosis not present

## 2018-03-04 DIAGNOSIS — I739 Peripheral vascular disease, unspecified: Secondary | ICD-10-CM | POA: Diagnosis not present

## 2018-03-04 DIAGNOSIS — I10 Essential (primary) hypertension: Secondary | ICD-10-CM | POA: Diagnosis not present

## 2018-03-04 DIAGNOSIS — D128 Benign neoplasm of rectum: Secondary | ICD-10-CM | POA: Diagnosis not present

## 2018-03-04 HISTORY — PX: ESOPHAGOGASTRODUODENOSCOPY (EGD) WITH PROPOFOL: SHX5813

## 2018-03-04 HISTORY — PX: COLONOSCOPY WITH PROPOFOL: SHX5780

## 2018-03-04 SURGERY — ESOPHAGOGASTRODUODENOSCOPY (EGD) WITH PROPOFOL
Anesthesia: General

## 2018-03-04 MED ORDER — MIDAZOLAM HCL 2 MG/2ML IJ SOLN
INTRAMUSCULAR | Status: DC | PRN
  Administered 2018-03-04: 1 mg via INTRAVENOUS

## 2018-03-04 MED ORDER — PROPOFOL 500 MG/50ML IV EMUL
INTRAVENOUS | Status: DC | PRN
  Administered 2018-03-04: 140 ug/kg/min via INTRAVENOUS

## 2018-03-04 MED ORDER — LIDOCAINE HCL (PF) 2 % IJ SOLN
INTRAMUSCULAR | Status: AC
  Filled 2018-03-04: qty 10

## 2018-03-04 MED ORDER — SODIUM CHLORIDE 0.9 % IV SOLN
INTRAVENOUS | Status: DC
  Administered 2018-03-04: 10:00:00 via INTRAVENOUS

## 2018-03-04 MED ORDER — FENTANYL CITRATE (PF) 100 MCG/2ML IJ SOLN
INTRAMUSCULAR | Status: DC | PRN
  Administered 2018-03-04: 50 ug via INTRAVENOUS

## 2018-03-04 MED ORDER — PROPOFOL 500 MG/50ML IV EMUL
INTRAVENOUS | Status: AC
  Filled 2018-03-04: qty 50

## 2018-03-04 MED ORDER — OMEPRAZOLE 20 MG PO CPDR: 20.0000 mg | DELAYED_RELEASE_CAPSULE | Freq: Two times a day (BID) | ORAL | 2 refills | Status: DC

## 2018-03-04 MED ORDER — PROPOFOL 10 MG/ML IV BOLUS
INTRAVENOUS | Status: DC | PRN
  Administered 2018-03-04 (×2): 51.1 mg via INTRAVENOUS

## 2018-03-04 MED ORDER — FENTANYL CITRATE (PF) 100 MCG/2ML IJ SOLN
INTRAMUSCULAR | Status: AC
  Filled 2018-03-04: qty 2

## 2018-03-04 MED ORDER — MIDAZOLAM HCL 2 MG/2ML IJ SOLN
INTRAMUSCULAR | Status: AC
  Filled 2018-03-04: qty 2

## 2018-03-04 NOTE — Anesthesia Post-op Follow-up Note (Signed)
Anesthesia QCDR form completed.        

## 2018-03-04 NOTE — Anesthesia Postprocedure Evaluation (Signed)
Anesthesia Post Note  Patient: Angie Duffy  Procedure(s) Performed: ESOPHAGOGASTRODUODENOSCOPY (EGD) WITH PROPOFOL (N/A ) COLONOSCOPY WITH PROPOFOL (N/A )  Patient location during evaluation: Endoscopy Anesthesia Type: General Level of consciousness: awake and alert Pain management: pain level controlled Vital Signs Assessment: post-procedure vital signs reviewed and stable Respiratory status: spontaneous breathing and respiratory function stable Cardiovascular status: stable Anesthetic complications: no     Last Vitals:  Vitals:   03/04/18 0939 03/04/18 1030  BP: (!) 155/87 99/61  Pulse: 66 66  Resp: 16 15  Temp: 36.5 C (!) 36.1 C  SpO2: 98% 100%    Last Pain:  Vitals:   03/04/18 1030  TempSrc: Tympanic                 KEPHART,WILLIAM K

## 2018-03-04 NOTE — Op Note (Signed)
[facility] Gastroenterology Patient Name: Angie Duffy Procedure Date: 03/04/2018 9:53 AM MRN: [id number] Account #: [id number] Date of Birth: March 19, 1961 Admit Type: Outpatient Age: 57 Room: [facility] ENDO ROOM 2 Gender: Female Note Status: Finalized Procedure:            Upper GI endoscopy Indications:          Heartburn Providers:            Lin Landsman MD, MD Referring MD:         Cletis Athens, MD (Referring MD) Medicines:            Monitored Anesthesia Care Complications:        No immediate complications. Estimated blood loss: None. Procedure:            Pre-Anesthesia Assessment:                       - Prior to the procedure, a History and Physical was                        performed, and patient medications and allergies were                        reviewed. The patient is competent. The risks and                        benefits of the procedure and the sedation options and                        risks were discussed with the patient. All questions                        were answered and informed consent was obtained.                        Patient identification and proposed procedure were                        verified by the physician, the nurse, the                        anesthesiologist, the anesthetist and the technician in                        the pre-procedure area in the procedure room in the                        endoscopy suite. Mental Status Examination: alert and                        oriented. Airway Examination: normal oropharyngeal                        airway and neck mobility. Respiratory Examination:                        clear to auscultation. CV Examination: normal.                        Prophylactic Antibiotics: The patient does not require  prophylactic antibiotics. Prior Anticoagulants: The                        patient has taken no previous anticoagulant or                        antiplatelet  agents. ASA Grade Assessment: III - A                        patient with severe systemic disease. After reviewing                        the risks and benefits, the patient was deemed in                        satisfactory condition to undergo the procedure. The                        anesthesia plan was to use monitored anesthesia care                        (MAC). Immediately prior to administration of                        medications, the patient was re-assessed for adequacy                        to receive sedatives. The heart rate, respiratory rate,                        oxygen saturations, blood pressure, adequacy of                        pulmonary ventilation, and response to care were                        monitored throughout the procedure. The physical status                        of the patient was re-assessed after the procedure.                       After obtaining informed consent, the endoscope was                        passed under direct vision. Throughout the procedure,                        the patient's blood pressure, pulse, and oxygen                        saturations were monitored continuously. The Endoscope                        was introduced through the mouth, and advanced to the                        second part of duodenum. The upper GI endoscopy was  accomplished without difficulty. The patient tolerated                        the procedure well. Findings:      The examined duodenum was normal.      The stomach was normal. Biopsies were taken with a cold forceps for       Helicobacter pylori testing.      The esophagus was normal. Biopsies were taken with a cold forceps for       histology.      Esophagogastric landmarks were identified: the gastroesophageal junction       was found at 36 cm from the incisors. Impression:           - Normal examined duodenum.                       - Normal stomach. Biopsied.                        - Normal esophagus. Biopsied.                       - Esophagogastric landmarks identified. Recommendation:       - Await pathology results.                       - Use a proton pump inhibitor PO daily for 3 months.                       - Proceed with colonoscopy as scheduled                       See colonoscopy report Procedure Code(s):    --- Professional ---                       [id number], Esophagogastroduodenoscopy, flexible, transoral;                        with biopsy, single or multiple Diagnosis Code(s):    --- Professional ---                       R12, Heartburn CPT copyright 2018 American Medical Association. All rights reserved. The codes documented in this report are preliminary and upon coder review may  be revised to meet current compliance requirements. Dr. Ulyess Mort Lin Landsman MD, MD 03/04/2018 10:10:09 AM This report has been signed electronically. Number of Addenda: 0 Note Initiated On: 03/04/2018 9:53 AM      [facility]

## 2018-03-04 NOTE — Op Note (Signed)
[facility] Gastroenterology Patient Name: Angie Duffy Procedure Date: 03/04/2018 9:52 AM MRN: [id number] Account #: [id number] Date of Birth: [birth year] Admit Type: Outpatient Age: 57 Room: [facility] ENDO ROOM 2 Gender: Female Note Status: Finalized Procedure:            Colonoscopy Indications:          Screening for colorectal malignant neoplasm, Last                        colonoscopy: September 2007 Providers:            Lin Landsman MD, MD Medicines:            Monitored Anesthesia Care Complications:        No immediate complications. Estimated blood loss: None. Procedure:            Pre-Anesthesia Assessment:                       - Prior to the procedure, a History and Physical was                        performed, and patient medications and allergies were                        reviewed. The patient is competent. The risks and                        benefits of the procedure and the sedation options and                        risks were discussed with the patient. All questions                        were answered and informed consent was obtained.                        Patient identification and proposed procedure were                        verified by the physician, the nurse, the                        anesthesiologist, the anesthetist and the technician in                        the pre-procedure area in the procedure room in the                        endoscopy suite. Mental Status Examination: alert and                        oriented. Airway Examination: normal oropharyngeal                        airway and neck mobility. Respiratory Examination:                        clear to auscultation. CV Examination: normal.  Prophylactic Antibiotics: The patient does not require                        prophylactic antibiotics. Prior Anticoagulants: The                        patient has taken no previous anticoagulant or                         antiplatelet agents. ASA Grade Assessment: III - A                        patient with severe systemic disease. After reviewing                        the risks and benefits, the patient was deemed in                        satisfactory condition to undergo the procedure. The                        anesthesia plan was to use monitored anesthesia care                        (MAC). Immediately prior to administration of                        medications, the patient was re-assessed for adequacy                        to receive sedatives. The heart rate, respiratory rate,                        oxygen saturations, blood pressure, adequacy of                        pulmonary ventilation, and response to care were                        monitored throughout the procedure. The physical status                        of the patient was re-assessed after the procedure.                       After obtaining informed consent, the colonoscope was                        passed under direct vision. Throughout the procedure,                        the patient's blood pressure, pulse, and oxygen                        saturations were monitored continuously. The                        Colonoscope was introduced through the anus and  advanced to the the cecum, identified by appendiceal                        orifice and ileocecal valve. The colonoscopy was                        performed with moderate difficulty due to significant                        looping and the patient's body habitus. Successful                        completion of the procedure was aided by changing the                        patient to a prone position and applying abdominal                        pressure. The patient tolerated the procedure well. The                        quality of the bowel preparation was evaluated using                        the BBPS [facility] Bowel Preparation Scale)  with scores                        of: Right Colon = 3, Transverse Colon = 3 and Left                        Colon = 3 (entire mucosa seen well with no residual                        staining, small fragments of stool or opaque liquid).                        The total BBPS score equals 9. Findings:      The perianal and digital rectal examinations were normal. Pertinent       negatives include normal sphincter tone and no palpable rectal lesions.      Skin tags were found on perianal exam.      Four sessile polyps were found in the rectum. The polyps were diminutive       in size. These polyps were removed with a cold snare. Resection and       retrieval were complete.      The entire examined colon appeared normal.      The retroflexed view of the distal rectum and anal verge was normal and       showed no anal or rectal abnormalities. Impression:           - Perianal skin tags found on perianal exam.                       - Four diminutive polyps in the rectum, removed with a                        cold snare. Resected and retrieved.                       -  The entire examined colon is normal.                       - The distal rectum and anal verge are normal on                        retroflexion view. Recommendation:       - Discharge patient to home (with escort).                       - Resume previous diet today.                       - Continue present medications.                       - Await pathology results.                       - Repeat colonoscopy in 5-10 years for surveillance                        based on pathology results. Procedure Code(s):    --- Professional ---                       [phone], Colonoscopy, flexible; with removal of tumor(s),                        polyp(s), or other lesion(s) by snare technique Diagnosis Code(s):    --- Professional ---                       Z12.11, Encounter for screening for malignant neoplasm                        of  colon                       K62.1, Rectal polyp                       K64.4, Residual hemorrhoidal skin tags CPT copyright 2018 American Medical Association. All rights reserved. The codes documented in this report are preliminary and upon coder review may  be revised to meet current compliance requirements. Dr. Ulyess Mort Lin Landsman MD, MD 03/04/2018 10:33:14 AM This report has been signed electronically. Number of Addenda: 0 Note Initiated On: 03/04/2018 9:52 AM Scope Withdrawal Time: 0 hours 11 minutes 34 seconds  Total Procedure Duration: 0 hours 18 minutes 0 seconds       [facility]

## 2018-03-04 NOTE — H&P (Signed)
Arlyss Repressohini R Vanga, MD [address]  [address]  [place], [place] [address]  Main: [phone]  Fax: [phone] Pager: [phone]  Primary Care Physician:  Corky DownsMasoud, Javed, MD Primary Gastroenterologist:  Dr. Arlyss Repressohini R Vanga  Pre-Procedure History & Physical: HPI:  Angie Duffy is a 57 y.o. female is here for an endoscopy and colonoscopy.   Past Medical History:  Diagnosis Date  . Calculus of kidney   . Hypertension   . Peripheral vascular disease [facility](HCC)     Past Surgical History:  Procedure Laterality Date  . ABDOMINAL HYSTERECTOMY    . COLONOSCOPY    . TOTAL ABDOMINAL HYSTERECTOMY W/ BILATERAL SALPINGOOPHORECTOMY  2006    Prior to Admission medications   Medication Sig Start Date End Date Taking? Authorizing Provider  amoxicillin (AMOXIL) 500 MG capsule TAKE 1 CAPSULE BY MOUTH EVERY 8 HOURS 02/08/18   [provider]  Vitamin D, Ergocalciferol, (DRISDOL) 1.25 MG (50000 UT) CAPS capsule Take 50,000 Units by mouth once a week. 12/14/17   [provider]    Allergies as of 02/12/2018 - Review Complete 02/11/2018  Allergen Reaction Noted  . Levaquin [levofloxacin] Anaphylaxis 05/13/2015    Family History  Problem Relation Age of Onset  . Emphysema Father   . Breast cancer Sister [id number]    Social History   Socioeconomic History  . Marital status: Married    Spouse name: Not on file  . Number of children: Not on file  . Years of education: Not on file  . Highest education level: Not on file  Occupational History  . Not on file  Social Needs  . Financial resource strain: Not on file  . Food insecurity:    Worry: Not on file    Inability: Not on file  . Transportation needs:    Medical: Not on file    Non-medical: Not on file  Tobacco Use  . Smoking status: Never Smoker  . Smokeless tobacco: Never Used  Substance and Sexual Activity  . Alcohol use: No  . Drug use: No  . Sexual activity: Not on file  Lifestyle  . Physical activity:   Days per week: Not on file    Minutes per session: Not on file  . Stress: Not on file  Relationships  . Social connections:    Talks on phone: Not on file    Gets together: Not on file    Attends religious service: Not on file    Active member of club or organization: Not on file    Attends meetings of clubs or organizations: Not on file    Relationship status: Not on file  . Intimate partner violence:    Fear of current or ex partner: Not on file    Emotionally abused: Not on file    Physically abused: Not on file    Forced sexual activity: Not on file  Other Topics Concern  . Not on file  Social History Narrative  . Not on file    Review of Systems: See HPI, otherwise negative ROS  Physical Exam: BP (!) 155/87   Pulse 66   Temp 97.7 F (36.5 C) (Tympanic)   Resp 16   Wt 102.1 kg   SpO2 98%   BMI 34.21 kg/m  General:   Alert,  pleasant and cooperative in NAD Head:  Normocephalic and atraumatic. Neck:  Supple; no masses or thyromegaly. Lungs:  Clear throughout to auscultation.    Heart:  Regular rate and rhythm. Abdomen:  Soft, nontender and nondistended. Normal bowel sounds, without guarding, and without rebound.   Neurologic:  Alert and  oriented x4;  grossly normal neurologically.  Impression/Plan: Angie Duffy is here for an endoscopy and colonoscopy to be performed for heart burn and colon cancer screening  Risks, benefits, limitations, and alternatives regarding  endoscopy and colonoscopy have been reviewed with the patient.  Questions have been answered.  All parties agreeable.   Lannette Donath, MD  03/04/2018, 9:51 AM

## 2018-03-04 NOTE — Anesthesia Preprocedure Evaluation (Signed)
Anesthesia Evaluation  Patient identified by MRN, date of birth, ID band Patient awake    Reviewed: Allergy & Precautions, NPO status , Patient's Chart, lab work & pertinent test results  History of Anesthesia Complications Negative for: history of anesthetic complications  Airway Mallampati: III       Dental   Pulmonary neg sleep apnea, neg COPD,           Cardiovascular hypertension (not on mds),      Neuro/Psych neg Seizures    GI/Hepatic Neg liver ROS, GERD  ,  Endo/Other  neg diabetes  Renal/GU negative Renal ROS     Musculoskeletal   Abdominal   Peds  Hematology   Anesthesia Other Findings   Reproductive/Obstetrics                            Anesthesia Physical Anesthesia Plan  ASA: III  Anesthesia Plan: General   Post-op Pain Management:    Induction:   PONV Risk Score and Plan: 3 and TIVA, Propofol infusion and Midazolam  Airway Management Planned: Nasal Cannula  Additional Equipment:   Intra-op Plan:   Post-operative Plan:   Informed Consent: I have reviewed the patients History and Physical, chart, labs and discussed the procedure including the risks, benefits and alternatives for the proposed anesthesia with the patient or authorized representative who has indicated his/her understanding and acceptance.     Plan Discussed with:   Anesthesia Plan Comments:         Anesthesia Quick Evaluation

## 2018-03-04 NOTE — Transfer of Care (Signed)
Immediate Anesthesia Transfer of Care Note  Patient: Angie Duffy  Procedure(s) Performed: ESOPHAGOGASTRODUODENOSCOPY (EGD) WITH PROPOFOL (N/A ) COLONOSCOPY WITH PROPOFOL (N/A )  Patient Location: PACU and Endoscopy Unit  Anesthesia Type:General  Level of Consciousness: sedated  Airway & Oxygen Therapy: Patient Spontanous Breathing  Post-op Assessment: Report given to RN  Post vital signs: stable  Last Vitals:  Vitals Value Taken Time  BP 99/61 03/04/2018 10:36 AM  Temp    Pulse 120 03/04/2018 10:36 AM  Resp 14 03/04/2018 10:36 AM  SpO2 99 % 03/04/2018 10:36 AM  Vitals shown include unvalidated device data.  Last Pain:  Vitals:   03/04/18 0939  TempSrc: Tympanic         Complications: No apparent anesthesia complications

## 2018-03-05 ENCOUNTER — Encounter: Payer: Self-pay | Admitting: Gastroenterology

## 2018-03-05 LAB — SURGICAL PATHOLOGY

## 2018-04-28 ENCOUNTER — Other Ambulatory Visit: Payer: Self-pay | Admitting: Internal Medicine

## 2018-04-28 DIAGNOSIS — Z1231 Encounter for screening mammogram for malignant neoplasm of breast: Secondary | ICD-10-CM

## 2018-05-20 ENCOUNTER — Ambulatory Visit
Admission: RE | Admit: 2018-05-20 | Discharge: 2018-05-20 | Disposition: A | Discharge status: Home / Self Care | Payer: BLUE CROSS/BLUE SHIELD | Source: Ambulatory Visit | Attending: Internal Medicine | Admitting: Internal Medicine

## 2018-05-20 DIAGNOSIS — Z1231 Encounter for screening mammogram for malignant neoplasm of breast: Secondary | ICD-10-CM | POA: Insufficient documentation

## 2018-05-31 DIAGNOSIS — Z1283 Encounter for screening for malignant neoplasm of skin: Secondary | ICD-10-CM | POA: Diagnosis not present

## 2018-05-31 DIAGNOSIS — L821 Other seborrheic keratosis: Secondary | ICD-10-CM | POA: Diagnosis not present

## 2018-05-31 DIAGNOSIS — L578 Other skin changes due to chronic exposure to nonionizing radiation: Secondary | ICD-10-CM | POA: Diagnosis not present

## 2018-05-31 DIAGNOSIS — D485 Neoplasm of uncertain behavior of skin: Secondary | ICD-10-CM | POA: Diagnosis not present

## 2018-05-31 DIAGNOSIS — L570 Actinic keratosis: Secondary | ICD-10-CM | POA: Diagnosis not present

## 2018-05-31 DIAGNOSIS — Z85820 Personal history of malignant melanoma of skin: Secondary | ICD-10-CM | POA: Diagnosis not present

## 2018-11-10 DIAGNOSIS — Z20828 Contact with and (suspected) exposure to other viral communicable diseases: Secondary | ICD-10-CM | POA: Diagnosis not present

## 2019-01-14 IMAGING — MG MM DIGITAL SCREENING BILAT W/ TOMO W/ CAD
8 of 13 series · 8 of 29 positions shown · non-contrast
Comparison: Previous exam(s).

CLINICAL DATA: Screening.

EXAM:
2D DIGITAL SCREENING BILATERAL MAMMOGRAM WITH 3D TOMO WITH CAD

[L CC (1 of 2)]
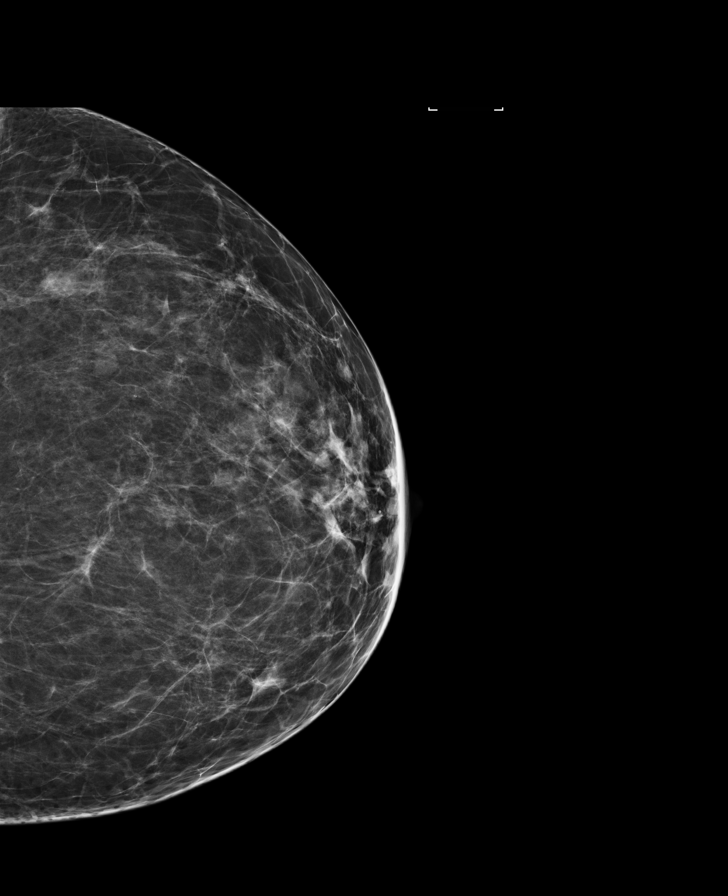

[L MLO synth-2D]
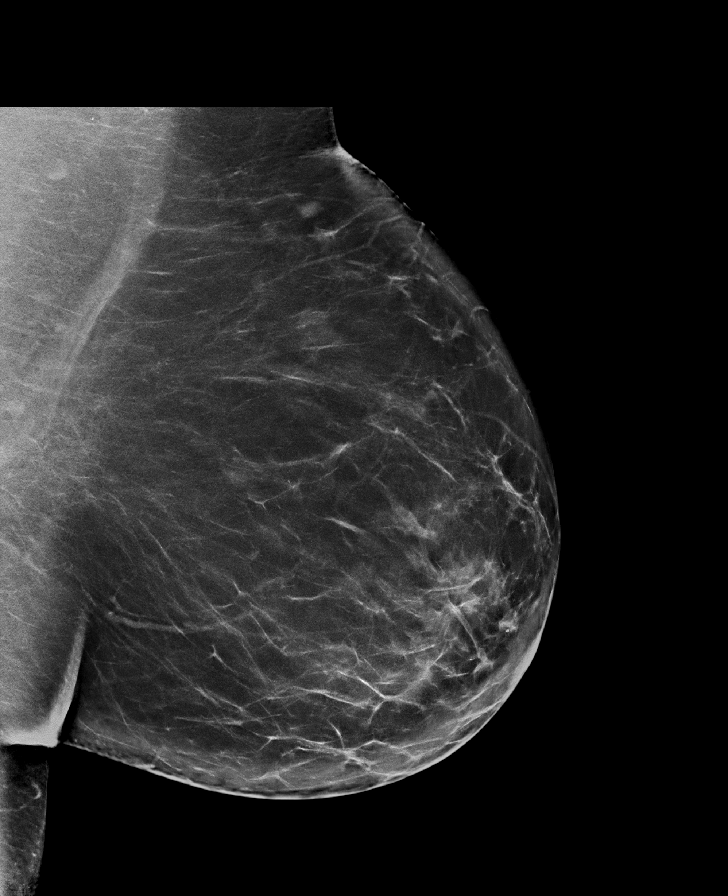

[L CC synth-2D]
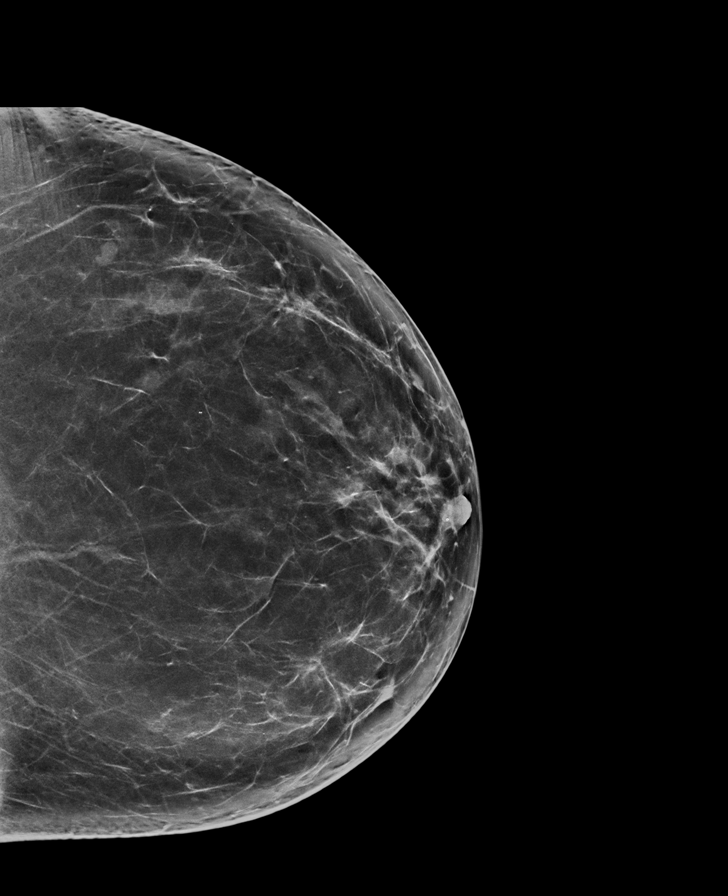

[R MLO]
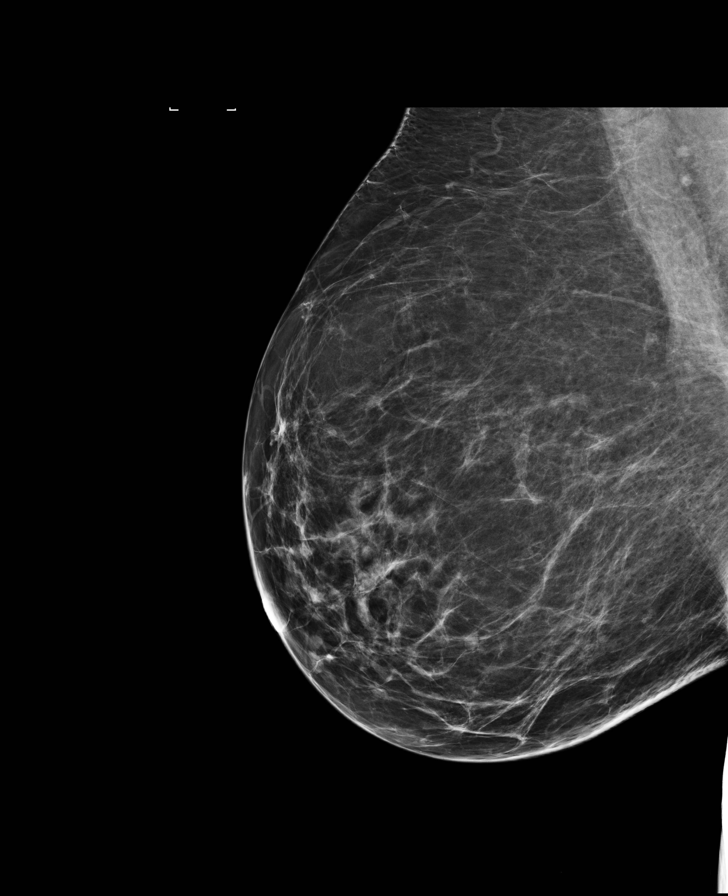

[L MLO]
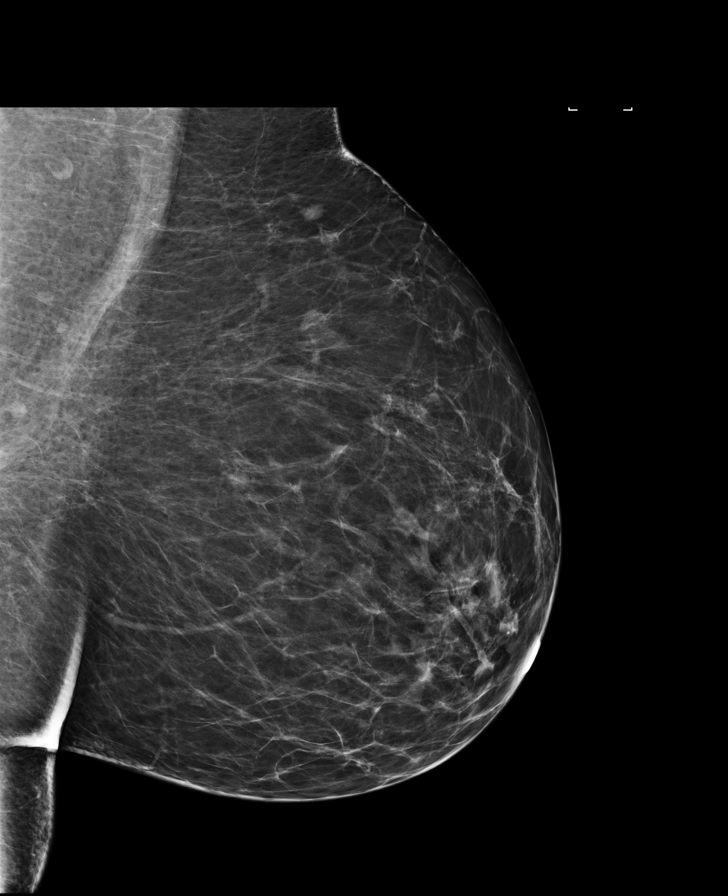

[R MLO synth-2D]
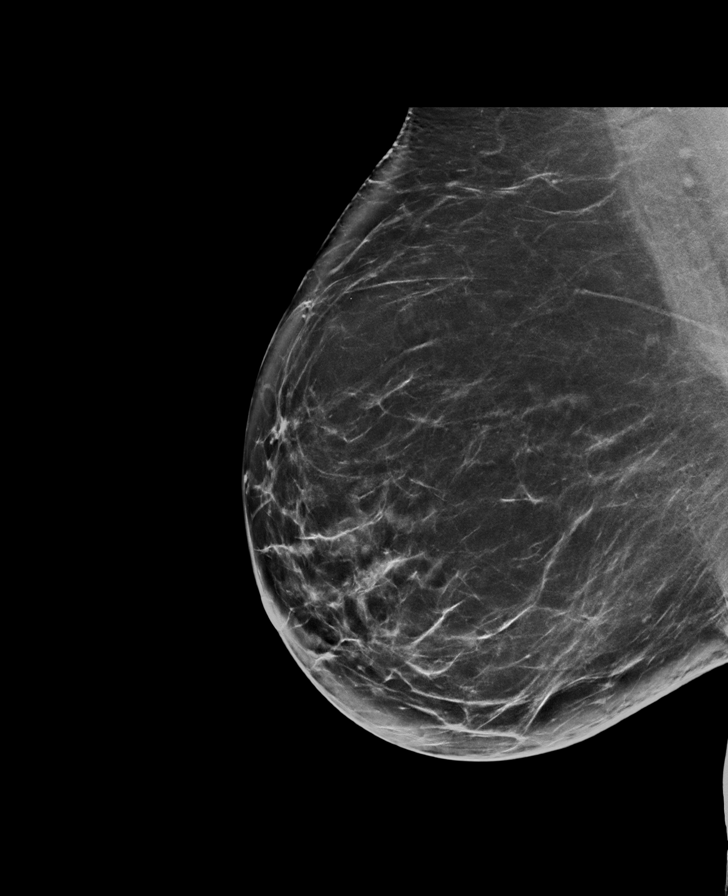

[R CC]
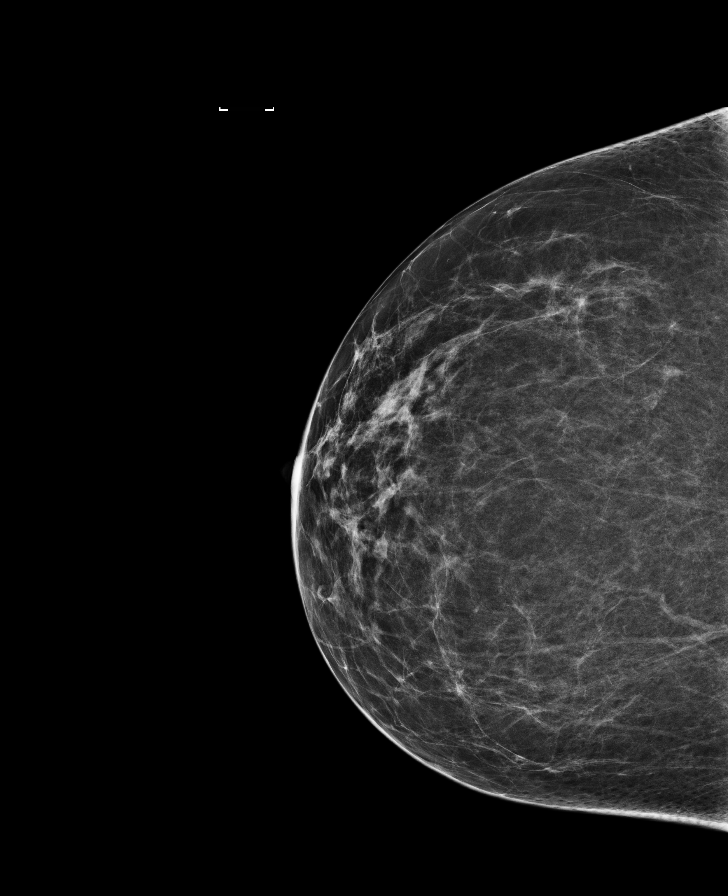

[L CC (2 of 2)]
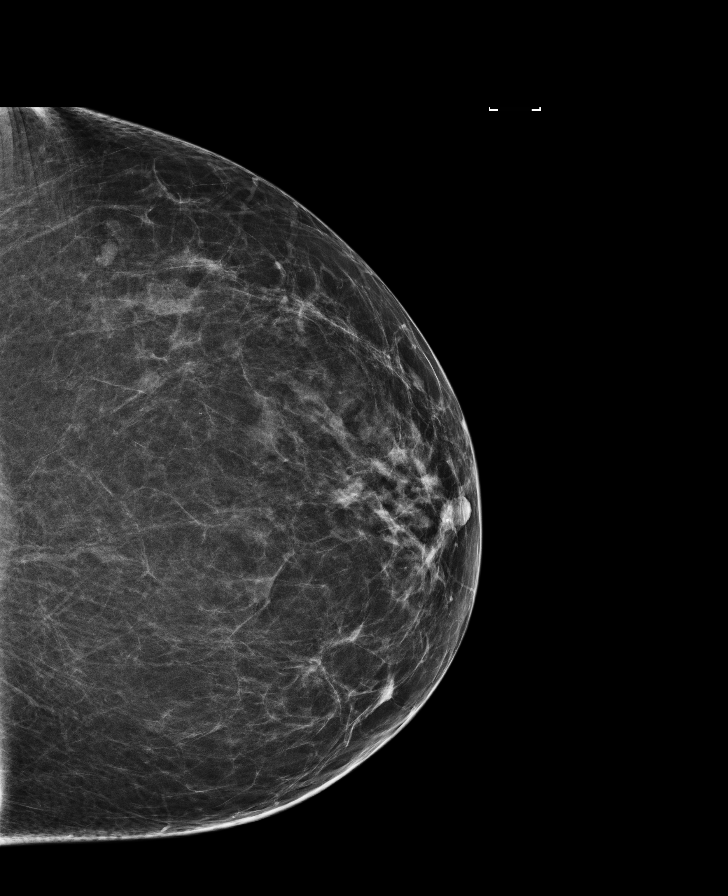

[8 of 29 positions shown; findings below may reference images not displayed]

ACR Breast Density Category b: There are scattered areas of
fibroglandular density.
FINDINGS: There are no findings suspicious for malignancy. Images were
processed with CAD.
IMPRESSION: No mammographic evidence of malignancy. A result letter of this
screening mammogram will be mailed directly to the patient.

RECOMMENDATION:
Screening mammogram in one year. (Code:[id number]-P-ZS0)

BI-RADS CATEGORY  1: Negative.

## 2019-04-19 ENCOUNTER — Other Ambulatory Visit: Payer: Self-pay | Admitting: Internal Medicine

## 2019-04-22 ENCOUNTER — Other Ambulatory Visit: Payer: Self-pay | Admitting: Internal Medicine

## 2019-04-22 DIAGNOSIS — Z1231 Encounter for screening mammogram for malignant neoplasm of breast: Secondary | ICD-10-CM

## 2019-05-11 DIAGNOSIS — Z86018 Personal history of other benign neoplasm: Secondary | ICD-10-CM | POA: Diagnosis not present

## 2019-05-11 DIAGNOSIS — L578 Other skin changes due to chronic exposure to nonionizing radiation: Secondary | ICD-10-CM | POA: Diagnosis not present

## 2019-05-11 DIAGNOSIS — Z872 Personal history of diseases of the skin and subcutaneous tissue: Secondary | ICD-10-CM | POA: Diagnosis not present

## 2019-05-11 DIAGNOSIS — Z85820 Personal history of malignant melanoma of skin: Secondary | ICD-10-CM | POA: Diagnosis not present

## 2019-05-25 ENCOUNTER — Ambulatory Visit
Admission: RE | Admit: 2019-05-25 | Discharge: 2019-05-25 | Disposition: A | Discharge status: Home / Self Care | Payer: BC Managed Care – PPO | Source: Ambulatory Visit | Attending: Internal Medicine | Admitting: Internal Medicine

## 2019-05-25 ENCOUNTER — Other Ambulatory Visit: Payer: Self-pay

## 2019-05-25 DIAGNOSIS — Z1231 Encounter for screening mammogram for malignant neoplasm of breast: Secondary | ICD-10-CM

## 2019-05-31 DIAGNOSIS — M1712 Unilateral primary osteoarthritis, left knee: Secondary | ICD-10-CM | POA: Diagnosis not present

## 2019-05-31 DIAGNOSIS — M7062 Trochanteric bursitis, left hip: Secondary | ICD-10-CM | POA: Diagnosis not present

## 2019-09-13 DIAGNOSIS — H43811 Vitreous degeneration, right eye: Secondary | ICD-10-CM | POA: Diagnosis not present

## 2019-11-01 DIAGNOSIS — Z20822 Contact with and (suspected) exposure to covid-19: Secondary | ICD-10-CM | POA: Diagnosis not present

## 2019-11-01 DIAGNOSIS — U071 COVID-19: Secondary | ICD-10-CM | POA: Diagnosis not present

## 2020-01-13 ENCOUNTER — Other Ambulatory Visit: Payer: Self-pay

## 2020-01-13 ENCOUNTER — Ambulatory Visit (INDEPENDENT_AMBULATORY_CARE_PROVIDER_SITE_OTHER): Payer: BC Managed Care – PPO | Admitting: Internal Medicine

## 2020-01-13 DIAGNOSIS — Z131 Encounter for screening for diabetes mellitus: Secondary | ICD-10-CM

## 2020-01-13 DIAGNOSIS — I10 Essential (primary) hypertension: Secondary | ICD-10-CM | POA: Diagnosis not present

## 2020-01-13 DIAGNOSIS — D649 Anemia, unspecified: Secondary | ICD-10-CM | POA: Diagnosis not present

## 2020-01-13 DIAGNOSIS — I83813 Varicose veins of bilateral lower extremities with pain: Secondary | ICD-10-CM

## 2020-01-13 DIAGNOSIS — E785 Hyperlipidemia, unspecified: Secondary | ICD-10-CM

## 2020-01-13 DIAGNOSIS — E559 Vitamin D deficiency, unspecified: Secondary | ICD-10-CM | POA: Diagnosis not present

## 2020-01-13 DIAGNOSIS — L659 Nonscarring hair loss, unspecified: Secondary | ICD-10-CM

## 2020-01-13 DIAGNOSIS — I890 Lymphedema, not elsewhere classified: Secondary | ICD-10-CM

## 2020-01-14 LAB — COMPLETE METABOLIC PANEL WITH GFR
AG Ratio: 1.8 (calc) (ref 1.0–2.5)
ALT: 25 U/L (ref 6–29)
AST: 20 U/L (ref 10–35)
Albumin: 4.2 g/dL (ref 3.6–5.1)
Alkaline phosphatase (APISO): 61 U/L (ref 37–153)
BUN: 12 mg/dL (ref 7–25)
CO2: 24 mmol/L (ref 20–32)
Calcium: 9.3 mg/dL (ref 8.6–10.4)
Chloride: 105 mmol/L (ref 98–110)
Creat: 0.77 mg/dL (ref 0.50–1.05)
GFR, Est African American: 98 mL/min/{1.73_m2} (ref >=60)
GFR, Est Non African American: 85 mL/min/{1.73_m2} (ref >=60)
Globulin: 2.3 g/dL (calc) (ref 1.9–3.7)
Glucose, Bld: 95 mg/dL (ref 65–99)
Potassium: 4.3 mmol/L (ref 3.5–5.3)
Sodium: 140 mmol/L (ref 135–146)
Total Bilirubin: 0.5 mg/dL (ref 0.2–1.2)
Total Protein: 6.5 g/dL (ref 6.1–8.1)

## 2020-01-14 LAB — CBC WITH DIFFERENTIAL/PLATELET
Absolute Monocytes: 470 cells/uL (ref 200–950)
Basophils Absolute: 38 cells/uL (ref 0–200)
Basophils Relative: 0.7 %
Eosinophils Absolute: 189 cells/uL (ref 15–500)
Eosinophils Relative: 3.5 %
HCT: 43.3 % (ref 35.0–45.0)
Hemoglobin: 14.4 g/dL (ref 11.7–15.5)
Lymphs Abs: 1534 cells/uL (ref 850–3900)
MCH: 28.7 pg (ref 27.0–33.0)
MCHC: 33.3 g/dL (ref 32.0–36.0)
MCV: 86.4 fL (ref 80.0–100.0)
MPV: 10.7 fL (ref 7.5–12.5)
Monocytes Relative: 8.7 %
Neutro Abs: 3170 cells/uL (ref 1500–7800)
Neutrophils Relative %: 58.7 %
Platelets: 237 10*3/uL (ref 140–400)
RBC: 5.01 10*6/uL (ref 3.80–5.10)
RDW: 12.9 % (ref 11.0–15.0)
Total Lymphocyte: 28.4 %
WBC: 5.4 10*3/uL (ref 3.8–10.8)

## 2020-01-14 LAB — IRON: Iron: 109 ug/dL (ref 45–160)

## 2020-01-14 LAB — LIPID PANEL
Cholesterol: 218 mg/dL — ABNORMAL HIGH (ref <200)
HDL: 52 mg/dL (ref >=50)
LDL Cholesterol (Calc): 136 mg/dL (calc) — ABNORMAL HIGH
Non-HDL Cholesterol (Calc): 166 mg/dL (calc) — ABNORMAL HIGH (ref <130)
Total CHOL/HDL Ratio: 4.2 (calc) (ref <5.0)
Triglycerides: 163 mg/dL — ABNORMAL HIGH (ref <150)

## 2020-01-14 LAB — TSH: TSH: 2.18 mIU/L (ref 0.40–4.50)

## 2020-01-14 LAB — HEMOGLOBIN A1C
Hgb A1c MFr Bld: 5.2 % of total Hgb (ref <5.7)
Mean Plasma Glucose: 103 (calc)
eAG (mmol/L): 5.7 (calc)

## 2020-01-14 LAB — C-REACTIVE PROTEIN: CRP: 1.4 mg/L (ref <8.0)

## 2020-01-14 LAB — FERRITIN: Ferritin: 47 ng/mL (ref 16–232)

## 2020-01-14 LAB — VITAMIN D 25 HYDROXY (VIT D DEFICIENCY, FRACTURES): Vit D, 25-Hydroxy: 37 ng/mL (ref 30–100)

## 2020-01-18 ENCOUNTER — Encounter: Payer: Self-pay | Admitting: Internal Medicine

## 2020-01-18 ENCOUNTER — Other Ambulatory Visit: Payer: Self-pay

## 2020-01-18 ENCOUNTER — Ambulatory Visit (INDEPENDENT_AMBULATORY_CARE_PROVIDER_SITE_OTHER): Payer: BC Managed Care – PPO | Admitting: Internal Medicine

## 2020-01-18 VITALS — BP 143/89 | HR 71 | Ht 66.0 in | Wt 217.0 lb

## 2020-01-18 DIAGNOSIS — I10 Essential (primary) hypertension: Secondary | ICD-10-CM

## 2020-01-18 DIAGNOSIS — Z0000 Encounter for general adult medical examination without abnormal findings: Secondary | ICD-10-CM | POA: Diagnosis not present

## 2020-01-18 DIAGNOSIS — E669 Obesity, unspecified: Secondary | ICD-10-CM

## 2020-01-18 DIAGNOSIS — I83813 Varicose veins of bilateral lower extremities with pain: Secondary | ICD-10-CM | POA: Diagnosis not present

## 2020-01-18 DIAGNOSIS — E785 Hyperlipidemia, unspecified: Secondary | ICD-10-CM

## 2020-01-18 NOTE — Progress Notes (Signed)
Established Patient Office Visit  Subjective:  Patient ID: Angie Duffy, female    DOB: 10/22/1960  Age: 59 y.o. MRN: [id number]  CC:  Chief Complaint  Patient presents with  . Annual Exam    HPI  Angie Duffy presents for general physical exam she denies any history of chest pain shortness of breath or paroxysmal nocturnal dyspnea.  She has a cold recently and was treated for that she does not give any other symptoms of tiredness and fatigue at the present time she however does not want to take a Covid shot..  Her problem is she is slightly overweight and she was advised to lose it gradually.  Past Medical History:  Diagnosis Date  . Calculus of kidney   . Hypertension   . Peripheral vascular disease [facility])     Past Surgical History:  Procedure Laterality Date  . ABDOMINAL HYSTERECTOMY    . COLONOSCOPY    . COLONOSCOPY WITH PROPOFOL N/A 03/04/2018   Procedure: COLONOSCOPY WITH PROPOFOL;  Surgeon: Toney Reil, MD;  Location: [facility] ENDOSCOPY;  Service: Gastroenterology;  Laterality: N/A;  . ESOPHAGOGASTRODUODENOSCOPY (EGD) WITH PROPOFOL N/A 03/04/2018   Procedure: ESOPHAGOGASTRODUODENOSCOPY (EGD) WITH PROPOFOL;  Surgeon: Toney Reil, MD;  Location: [facility] ENDOSCOPY;  Service: Gastroenterology;  Laterality: N/A;  . TOTAL ABDOMINAL HYSTERECTOMY W/ BILATERAL SALPINGOOPHORECTOMY  2006    Family History  Problem Relation Age of Onset  . Emphysema Father   . Breast cancer Sister 18    Social History   Socioeconomic History  . Marital status: Married    Spouse name: Not on file  . Number of children: Not on file  . Years of education: Not on file  . Highest education level: Not on file  Occupational History  . Not on file  Tobacco Use  . Smoking status: Never Smoker  . Smokeless tobacco: Never Used  Substance and Sexual Activity  . Alcohol use: No  . Drug use: No  . Sexual activity: Yes  Other Topics Concern  . Not on file  Social History Narrative  .  Not on file   Social Determinants of Health   Financial Resource Strain:   . Difficulty of Paying Living Expenses: Not on file  Food Insecurity:   . Worried About Programme researcher, broadcasting/film/video in the Last Year: Not on file  . Ran Out of Food in the Last Year: Not on file  Transportation Needs:   . Lack of Transportation (Medical): Not on file  . Lack of Transportation (Non-Medical): Not on file  Physical Activity:   . Days of Exercise per Week: Not on file  . Minutes of Exercise per Session: Not on file  Stress:   . Feeling of Stress : Not on file  Social Connections:   . Frequency of Communication with Friends and Family: Not on file  . Frequency of Social Gatherings with Friends and Family: Not on file  . Attends Religious Services: Not on file  . Active Member of Clubs or Organizations: Not on file  . Attends Banker Meetings: Not on file  . Marital Status: Not on file  Intimate Partner Violence:   . Fear of Current or Ex-Partner: Not on file  . Emotionally Abused: Not on file  . Physically Abused: Not on file  . Sexually Abused: Not on file    No current outpatient medications on file.   Allergies  Allergen Reactions  . Levaquin [Levofloxacin] Anaphylaxis    ROS Review  of Systems  Constitutional: Negative.   HENT: Negative.   Eyes: Negative.   Respiratory: Negative.   Cardiovascular: Negative.   Gastrointestinal: Negative.   Endocrine: Negative.   Genitourinary: Negative.   Musculoskeletal: Negative.   Skin: Negative.   Allergic/Immunologic: Negative.   Neurological: Negative.   Hematological: Negative.   Psychiatric/Behavioral: Negative.   All other systems reviewed and are negative.     Objective:    Physical Exam Vitals reviewed.  Constitutional:      Appearance: Normal appearance.  HENT:     Mouth/Throat:     Mouth: Mucous membranes are moist.  Eyes:     Pupils: Pupils are equal, round, and reactive to light.  Neck:     Vascular: No  carotid bruit.  Cardiovascular:     Rate and Rhythm: Normal rate and regular rhythm.     Pulses: Normal pulses.     Heart sounds: Normal heart sounds.  Pulmonary:     Effort: Pulmonary effort is normal.     Breath sounds: Normal breath sounds.  Abdominal:     General: Bowel sounds are normal.     Palpations: Abdomen is soft. There is no hepatomegaly, splenomegaly or mass.     Tenderness: There is no abdominal tenderness.     Hernia: No hernia is present.  Musculoskeletal:        General: No tenderness.     Cervical back: Neck supple.     Right lower leg: No edema.     Left lower leg: No edema.  Skin:    Findings: No rash.  Neurological:     Mental Status: She is alert and oriented to person, place, and time.     Motor: No weakness.  Psychiatric:        Mood and Affect: Mood and affect normal.        Behavior: Behavior normal.     BP (!) 143/89   Pulse 71   Ht 5\' 6"  (1.676 m)   Wt 217 lb (98.4 kg)   BMI 35.02 kg/m  Wt Readings from Last 3 Encounters:  01/18/20 217 lb (98.4 kg)  03/04/18 225 lb (102.1 kg)  02/11/18 234 lb (106.1 kg)     Health Maintenance Due  Topic Date Due  . Hepatitis C Screening  Never done  . COVID-19 Vaccine (1) Never done  . HIV Screening  Never done  . PAP SMEAR-Modifier  12/07/2011  . INFLUENZA VACCINE  Never done    There are no preventive care reminders to display for this patient.  Lab Results  Component Value Date   TSH 2.18 01/13/2020   Lab Results  Component Value Date   WBC 5.4 01/13/2020   HGB 14.4 01/13/2020   HCT 43.3 01/13/2020   MCV 86.4 01/13/2020   PLT 237 01/13/2020   Lab Results  Component Value Date   NA 140 01/13/2020   K 4.3 01/13/2020   CO2 24 01/13/2020   GLUCOSE 95 01/13/2020   BUN 12 01/13/2020   CREATININE 0.77 01/13/2020   BILITOT 0.5 01/13/2020   ALKPHOS 56 04/03/2017   AST 20 01/13/2020   ALT 25 01/13/2020   PROT 6.5 01/13/2020   ALBUMIN 4.2 04/03/2017   CALCIUM 9.3 01/13/2020   ANIONGAP  7 05/13/2015   Lab Results  Component Value Date   CHOL 218 (H) 01/13/2020   Lab Results  Component Value Date   HDL 52 01/13/2020   Lab Results  Component Value Date   LDLCALC 136 (  H) 01/13/2020   Lab Results  Component Value Date   TRIG 163 (H) 01/13/2020   Lab Results  Component Value Date   CHOLHDL 4.2 01/13/2020   Lab Results  Component Value Date   HGBA1C 5.2 01/13/2020      Assessment & Plan:   Problem List Items Addressed This Visit      Cardiovascular and Mediastinum   Varicose veins of both lower extremities with pain    Patient does not have any swelling of the legs.      Essential hypertension - Primary    - Today, the patient's blood pressure is well managed on present regime. - The patient will continue the current treatment regimen.  - I encouraged the patient to eat a low-sodium diet to help control blood pressure. - I encouraged the patient to live an active lifestyle and complete activities that increases heart rate to 85% target heart rate at least 5 times per week for one hour.            Other   Obesity (BMI 35.0-39.9 without comorbidity)    - I encouraged the patient to lose weight.  - I educated them on making healthy dietary choices including eating more fruits and vegetables and less fried foods. - I encouraged the patient to exercise more, and educated on the benefits of exercise including weight loss, diabetes prevention, and hypertension prevention.        Annual physical exam    Patient is a well-nourished white female in no acute distress.  HEENT examination is normal neck is supple jugular venous pressure is not elevated carotid upstroke is 2+ without any bruit.  Heart is regular chest is clear abdomen is soft.  There is no pedal edema no calf tenderness.  She had a mammogram done recently and it was unremarkable.  She does not need a pelvic examination.  Her main problem at the present time is weight and she is familiar with the  diet I advised her to walk on a daily basis to lose weight.  She had a covid recently and I advised her to take Covid shot to increase her immunity.  She does not want to do it at the present time.      Hyperlipidemia    -.  - I encouraged the patient to eat more vegetables and whole wheat, and to avoid fatty foods like whole milk, hard cheese, egg yolks, margarine, baked sweets, and fried foods.  - I encouraged the patient to live an active lifestyle and complete activities for 40 minutes at least three times per week.  - I instructed the patient to go to the ER if they begin having chest pain.           No orders of the defined types were placed in this encounter.   Follow-up: No follow-ups on file.    Corky Downs, MD

## 2020-01-19 ENCOUNTER — Encounter: Payer: Self-pay | Admitting: Internal Medicine

## 2020-01-19 DIAGNOSIS — I10 Essential (primary) hypertension: Secondary | ICD-10-CM | POA: Insufficient documentation

## 2020-01-19 DIAGNOSIS — Z0000 Encounter for general adult medical examination without abnormal findings: Secondary | ICD-10-CM | POA: Insufficient documentation

## 2020-01-19 DIAGNOSIS — E669 Obesity, unspecified: Secondary | ICD-10-CM | POA: Insufficient documentation

## 2020-01-19 DIAGNOSIS — E785 Hyperlipidemia, unspecified: Secondary | ICD-10-CM | POA: Insufficient documentation

## 2020-01-19 NOTE — Assessment & Plan Note (Signed)
  -   I encouraged the patient to eat more vegetables and whole wheat, and to avoid fatty foods like whole milk, hard cheese, egg yolks, margarine, baked sweets, and fried foods.  - I encouraged the patient to live an active lifestyle and complete activities for 40 minutes at least three times per week.  - I instructed the patient to go to the ER if they begin having chest pain.   

## 2020-01-19 NOTE — Assessment & Plan Note (Signed)
Patient is a well-nourished white female in no acute distress.  HEENT examination is normal neck is supple jugular venous pressure is not elevated carotid upstroke is 2+ without any bruit.  Heart is regular chest is clear abdomen is soft.  There is no pedal edema no calf tenderness.  She had a mammogram done recently and it was unremarkable.  She does not need a pelvic examination.  Her main problem at the present time is weight and she is familiar with the diet I advised her to walk on a daily basis to lose weight.  She had a covid recently and I advised her to take Covid shot to increase her immunity.  She does not want to do it at the present time.

## 2020-01-19 NOTE — Assessment & Plan Note (Signed)
-   Today, the patient's blood pressure is well managed on present regime. - The patient will continue the current treatment regimen.  - I encouraged the patient to eat a low-sodium diet to help control blood pressure. - I encouraged the patient to live an active lifestyle and complete activities that increases heart rate to 85% target heart rate at least 5 times per week for one hour.      

## 2020-01-19 NOTE — Assessment & Plan Note (Signed)
Patient does not have any swelling of the legs.

## 2020-01-19 NOTE — Assessment & Plan Note (Signed)
-   I encouraged the patient to lose weight.  - I educated them on making healthy dietary choices including eating more fruits and vegetables and less fried foods. - I encouraged the patient to exercise more, and educated on the benefits of exercise including weight loss, diabetes prevention, and hypertension prevention.   

## 2020-01-31 DIAGNOSIS — Z85820 Personal history of malignant melanoma of skin: Secondary | ICD-10-CM | POA: Diagnosis not present

## 2020-01-31 DIAGNOSIS — Z86018 Personal history of other benign neoplasm: Secondary | ICD-10-CM | POA: Diagnosis not present

## 2020-01-31 DIAGNOSIS — Z872 Personal history of diseases of the skin and subcutaneous tissue: Secondary | ICD-10-CM | POA: Diagnosis not present

## 2020-01-31 DIAGNOSIS — L578 Other skin changes due to chronic exposure to nonionizing radiation: Secondary | ICD-10-CM | POA: Diagnosis not present

## 2020-03-16 DIAGNOSIS — D259 Leiomyoma of uterus, unspecified: Secondary | ICD-10-CM | POA: Diagnosis not present

## 2020-03-16 DIAGNOSIS — E611 Iron deficiency: Secondary | ICD-10-CM | POA: Diagnosis not present

## 2020-03-16 DIAGNOSIS — E7211 Homocystinuria: Secondary | ICD-10-CM | POA: Diagnosis not present

## 2020-03-16 DIAGNOSIS — E569 Vitamin deficiency, unspecified: Secondary | ICD-10-CM | POA: Diagnosis not present

## 2020-03-26 DIAGNOSIS — R6882 Decreased libido: Secondary | ICD-10-CM | POA: Diagnosis not present

## 2020-03-26 DIAGNOSIS — G8929 Other chronic pain: Secondary | ICD-10-CM | POA: Diagnosis not present

## 2020-03-26 DIAGNOSIS — R891 Abnormal level of hormones in specimens from other organs, systems and tissues: Secondary | ICD-10-CM | POA: Diagnosis not present

## 2020-04-02 ENCOUNTER — Ambulatory Visit: Payer: BC Managed Care – PPO | Admitting: Internal Medicine

## 2020-06-20 ENCOUNTER — Other Ambulatory Visit: Payer: Self-pay | Admitting: Internal Medicine

## 2020-06-20 DIAGNOSIS — Z1231 Encounter for screening mammogram for malignant neoplasm of breast: Secondary | ICD-10-CM

## 2020-07-13 ENCOUNTER — Ambulatory Visit
Admission: RE | Admit: 2020-07-13 | Discharge: 2020-07-13 | Disposition: A | Discharge status: Home / Self Care | Payer: BC Managed Care – PPO | Source: Ambulatory Visit | Attending: Internal Medicine | Admitting: Internal Medicine

## 2020-07-13 ENCOUNTER — Other Ambulatory Visit: Payer: Self-pay

## 2020-07-13 DIAGNOSIS — Z1231 Encounter for screening mammogram for malignant neoplasm of breast: Secondary | ICD-10-CM

## 2020-07-18 ENCOUNTER — Ambulatory Visit: Payer: BC Managed Care – PPO | Admitting: Internal Medicine

## 2020-08-13 ENCOUNTER — Ambulatory Visit: Payer: BC Managed Care – PPO

## 2021-10-11 ENCOUNTER — Other Ambulatory Visit: Payer: Self-pay | Admitting: Internal Medicine

## 2021-10-16 ENCOUNTER — Other Ambulatory Visit: Payer: Self-pay | Admitting: Internal Medicine

## 2021-10-16 DIAGNOSIS — Z1231 Encounter for screening mammogram for malignant neoplasm of breast: Secondary | ICD-10-CM

## 2021-11-05 ENCOUNTER — Other Ambulatory Visit: Payer: Self-pay | Admitting: Internal Medicine

## 2021-11-05 DIAGNOSIS — Z1231 Encounter for screening mammogram for malignant neoplasm of breast: Secondary | ICD-10-CM

## 2021-11-29 ENCOUNTER — Ambulatory Visit: Payer: Self-pay

## 2021-12-06 ENCOUNTER — Ambulatory Visit
Admission: RE | Admit: 2021-12-06 | Discharge: 2021-12-06 | Disposition: A | Discharge status: Home / Self Care | Payer: 59 | Source: Ambulatory Visit | Attending: Internal Medicine | Admitting: Internal Medicine

## 2021-12-06 DIAGNOSIS — Z1231 Encounter for screening mammogram for malignant neoplasm of breast: Secondary | ICD-10-CM

## 2022-01-28 ENCOUNTER — Other Ambulatory Visit: Payer: Self-pay | Admitting: Physician Assistant

## 2022-01-28 DIAGNOSIS — M2392 Unspecified internal derangement of left knee: Secondary | ICD-10-CM

## 2022-01-30 DIAGNOSIS — Z86018 Personal history of other benign neoplasm: Secondary | ICD-10-CM | POA: Diagnosis not present

## 2022-01-30 DIAGNOSIS — E559 Vitamin D deficiency, unspecified: Secondary | ICD-10-CM | POA: Diagnosis not present

## 2022-01-30 DIAGNOSIS — D229 Melanocytic nevi, unspecified: Secondary | ICD-10-CM | POA: Diagnosis not present

## 2022-01-30 DIAGNOSIS — Z85820 Personal history of malignant melanoma of skin: Secondary | ICD-10-CM | POA: Diagnosis not present

## 2022-01-30 DIAGNOSIS — Z79899 Other long term (current) drug therapy: Secondary | ICD-10-CM | POA: Diagnosis not present

## 2022-01-30 DIAGNOSIS — L578 Other skin changes due to chronic exposure to nonionizing radiation: Secondary | ICD-10-CM | POA: Diagnosis not present

## 2022-01-30 DIAGNOSIS — L281 Prurigo nodularis: Secondary | ICD-10-CM | POA: Diagnosis not present

## 2022-01-30 DIAGNOSIS — Z872 Personal history of diseases of the skin and subcutaneous tissue: Secondary | ICD-10-CM | POA: Diagnosis not present

## 2022-02-05 ENCOUNTER — Ambulatory Visit
Admission: RE | Admit: 2022-02-05 | Discharge: 2022-02-05 | Disposition: A | Discharge status: Home / Self Care | Payer: 59 | Source: Ambulatory Visit | Attending: Physician Assistant | Admitting: Physician Assistant

## 2022-02-05 DIAGNOSIS — M25562 Pain in left knee: Secondary | ICD-10-CM | POA: Diagnosis not present

## 2022-02-05 DIAGNOSIS — M2392 Unspecified internal derangement of left knee: Secondary | ICD-10-CM

## 2022-02-26 DIAGNOSIS — M2392 Unspecified internal derangement of left knee: Secondary | ICD-10-CM | POA: Diagnosis not present

## 2022-02-26 DIAGNOSIS — S83272A Complex tear of lateral meniscus, current injury, left knee, initial encounter: Secondary | ICD-10-CM | POA: Diagnosis not present

## 2022-02-26 DIAGNOSIS — M1712 Unilateral primary osteoarthritis, left knee: Secondary | ICD-10-CM | POA: Diagnosis not present

## 2022-12-11 ENCOUNTER — Other Ambulatory Visit: Payer: Self-pay | Admitting: Internal Medicine

## 2022-12-11 DIAGNOSIS — Z1231 Encounter for screening mammogram for malignant neoplasm of breast: Secondary | ICD-10-CM

## 2023-02-02 ENCOUNTER — Ambulatory Visit: Payer: 59

## 2023-04-06 ENCOUNTER — Ambulatory Visit: Payer: 59

## 2023-04-10 DIAGNOSIS — B309 Viral conjunctivitis, unspecified: Secondary | ICD-10-CM | POA: Diagnosis not present

## 2023-04-22 ENCOUNTER — Ambulatory Visit: Payer: 59

## 2023-04-24 ENCOUNTER — Ambulatory Visit
Admission: RE | Admit: 2023-04-24 | Discharge: 2023-04-24 | Disposition: A | Discharge status: Home / Self Care | Payer: 59 | Source: Ambulatory Visit | Attending: Internal Medicine | Admitting: Internal Medicine

## 2023-04-24 DIAGNOSIS — Z1231 Encounter for screening mammogram for malignant neoplasm of breast: Secondary | ICD-10-CM

## 2023-04-28 ENCOUNTER — Other Ambulatory Visit: Payer: Self-pay | Admitting: Nurse Practitioner

## 2023-04-28 ENCOUNTER — Other Ambulatory Visit: Payer: Self-pay | Admitting: Internal Medicine

## 2023-04-28 DIAGNOSIS — R928 Other abnormal and inconclusive findings on diagnostic imaging of breast: Secondary | ICD-10-CM

## 2023-04-30 DIAGNOSIS — M67814 Other specified disorders of tendon, left shoulder: Secondary | ICD-10-CM | POA: Diagnosis not present

## 2023-05-05 ENCOUNTER — Other Ambulatory Visit: Payer: Self-pay | Admitting: Nurse Practitioner

## 2023-05-05 ENCOUNTER — Ambulatory Visit
Admission: RE | Admit: 2023-05-05 | Discharge: 2023-05-05 | Disposition: A | Discharge status: Home / Self Care | Payer: 59 | Source: Ambulatory Visit | Attending: Nurse Practitioner | Admitting: Nurse Practitioner

## 2023-05-05 DIAGNOSIS — N6310 Unspecified lump in the right breast, unspecified quadrant: Secondary | ICD-10-CM

## 2023-05-05 DIAGNOSIS — R928 Other abnormal and inconclusive findings on diagnostic imaging of breast: Secondary | ICD-10-CM

## 2023-05-05 DIAGNOSIS — N6315 Unspecified lump in the right breast, overlapping quadrants: Secondary | ICD-10-CM | POA: Diagnosis not present

## 2023-05-12 ENCOUNTER — Other Ambulatory Visit: Payer: 59

## 2023-11-03 ENCOUNTER — Other Ambulatory Visit: Payer: Self-pay | Admitting: Nurse Practitioner

## 2023-11-03 ENCOUNTER — Ambulatory Visit
Admission: RE | Admit: 2023-11-03 | Discharge: 2023-11-03 | Disposition: A | Discharge status: Home / Self Care | Payer: 59 | Source: Ambulatory Visit | Attending: Nurse Practitioner | Admitting: Nurse Practitioner

## 2023-11-03 ENCOUNTER — Ambulatory Visit
Admission: RE | Admit: 2023-11-03 | Discharge: 2023-11-03 | Disposition: A | Discharge status: Home / Self Care | Source: Ambulatory Visit | Attending: Nurse Practitioner | Admitting: Nurse Practitioner

## 2023-11-03 DIAGNOSIS — N6310 Unspecified lump in the right breast, unspecified quadrant: Secondary | ICD-10-CM

## 2024-04-15 ENCOUNTER — Other Ambulatory Visit: Payer: Self-pay | Admitting: *Deleted

## 2024-04-15 DIAGNOSIS — E063 Autoimmune thyroiditis: Secondary | ICD-10-CM

## 2024-04-28 ENCOUNTER — Other Ambulatory Visit
